# Patient Record
Sex: Female | Born: 1940 | Race: Black or African American | Hispanic: No | Marital: Single | Smoking: Never smoker
Health system: Southern US, Community
[De-identification: ages and names within clinical notes are randomized; demographics above are authoritative.]

## PROBLEM LIST (undated history)

## (undated) DIAGNOSIS — I6203 Nontraumatic chronic subdural hemorrhage: Secondary | ICD-10-CM

## (undated) DIAGNOSIS — I10 Essential (primary) hypertension: Secondary | ICD-10-CM

## (undated) DIAGNOSIS — N183 Chronic kidney disease, stage 3 (moderate): Secondary | ICD-10-CM

## (undated) DIAGNOSIS — I6200 Nontraumatic subdural hemorrhage, unspecified: Secondary | ICD-10-CM

## (undated) DIAGNOSIS — D509 Iron deficiency anemia, unspecified: Secondary | ICD-10-CM

## (undated) DIAGNOSIS — J449 Chronic obstructive pulmonary disease, unspecified: Secondary | ICD-10-CM

## (undated) DIAGNOSIS — I5033 Acute on chronic diastolic (congestive) heart failure: Secondary | ICD-10-CM

## (undated) DIAGNOSIS — K219 Gastro-esophageal reflux disease without esophagitis: Secondary | ICD-10-CM

## (undated) DIAGNOSIS — F1120 Opioid dependence, uncomplicated: Secondary | ICD-10-CM

## (undated) DIAGNOSIS — I5030 Unspecified diastolic (congestive) heart failure: Secondary | ICD-10-CM

## (undated) HISTORY — DX: Iron deficiency anemia, unspecified: D50.9

## (undated) HISTORY — DX: Gastro-esophageal reflux disease without esophagitis: K21.9

## (undated) HISTORY — DX: Chronic kidney disease, stage 3 (moderate): N18.3

## (undated) HISTORY — PX: EYE SURGERY: SHX253

## (undated) HISTORY — DX: Nontraumatic subdural hemorrhage, unspecified: I62.00

## (undated) HISTORY — DX: Acute on chronic diastolic (congestive) heart failure: I50.33

## (undated) HISTORY — DX: Unspecified diastolic (congestive) heart failure: I50.30

## (undated) HISTORY — PX: BURR HOLE FOR SUBDURAL HEMATOMA: SHX1275

## (undated) HISTORY — PX: TUBAL LIGATION: SHX77

## (undated) HISTORY — DX: Opioid dependence, uncomplicated: F11.20

## (undated) HISTORY — DX: Chronic obstructive pulmonary disease, unspecified: J44.9

## (undated) HISTORY — DX: Nontraumatic chronic subdural hemorrhage: I62.03

## (undated) HISTORY — PX: ABDOMINAL HYSTERECTOMY: SHX81

## (undated) HISTORY — DX: Essential (primary) hypertension: I10

---

## 2016-01-05 DIAGNOSIS — D509 Iron deficiency anemia, unspecified: Secondary | ICD-10-CM

## 2016-01-05 DIAGNOSIS — I480 Paroxysmal atrial fibrillation: Secondary | ICD-10-CM

## 2016-01-05 DIAGNOSIS — I10 Essential (primary) hypertension: Secondary | ICD-10-CM

## 2016-01-05 DIAGNOSIS — J449 Chronic obstructive pulmonary disease, unspecified: Secondary | ICD-10-CM

## 2016-01-05 DIAGNOSIS — K219 Gastro-esophageal reflux disease without esophagitis: Secondary | ICD-10-CM | POA: Insufficient documentation

## 2016-01-05 HISTORY — DX: Paroxysmal atrial fibrillation: I48.0

## 2016-01-05 HISTORY — DX: Iron deficiency anemia, unspecified: D50.9

## 2016-01-05 HISTORY — DX: Essential (primary) hypertension: I10

## 2016-01-05 HISTORY — DX: Gastro-esophageal reflux disease without esophagitis: K21.9

## 2016-01-05 HISTORY — DX: Chronic obstructive pulmonary disease, unspecified: J44.9

## 2016-05-07 DIAGNOSIS — M545 Low back pain, unspecified: Secondary | ICD-10-CM

## 2016-05-07 DIAGNOSIS — N183 Chronic kidney disease, stage 3 unspecified: Secondary | ICD-10-CM

## 2016-05-07 DIAGNOSIS — G8929 Other chronic pain: Secondary | ICD-10-CM | POA: Insufficient documentation

## 2016-05-07 DIAGNOSIS — M8589 Other specified disorders of bone density and structure, multiple sites: Secondary | ICD-10-CM | POA: Insufficient documentation

## 2016-05-07 HISTORY — DX: Other chronic pain: G89.29

## 2016-05-07 HISTORY — DX: Chronic kidney disease, stage 3 unspecified: N18.30

## 2016-05-07 HISTORY — DX: Low back pain, unspecified: M54.50

## 2016-05-07 HISTORY — DX: Other specified disorders of bone density and structure, multiple sites: M85.89

## 2016-07-24 DIAGNOSIS — F1120 Opioid dependence, uncomplicated: Secondary | ICD-10-CM | POA: Insufficient documentation

## 2016-07-24 HISTORY — DX: Opioid dependence, uncomplicated: F11.20

## 2016-10-31 DIAGNOSIS — R0602 Shortness of breath: Secondary | ICD-10-CM

## 2016-10-31 HISTORY — DX: Shortness of breath: R06.02

## 2016-12-25 DIAGNOSIS — I6200 Nontraumatic subdural hemorrhage, unspecified: Secondary | ICD-10-CM

## 2016-12-25 DIAGNOSIS — Z8679 Personal history of other diseases of the circulatory system: Secondary | ICD-10-CM | POA: Insufficient documentation

## 2016-12-25 HISTORY — DX: Personal history of other diseases of the circulatory system: Z86.79

## 2016-12-25 HISTORY — DX: Nontraumatic subdural hemorrhage, unspecified: I62.00

## 2016-12-26 DIAGNOSIS — I6203 Nontraumatic chronic subdural hemorrhage: Secondary | ICD-10-CM

## 2016-12-26 HISTORY — DX: Nontraumatic chronic subdural hemorrhage: I62.03

## 2016-12-27 DIAGNOSIS — I5033 Acute on chronic diastolic (congestive) heart failure: Secondary | ICD-10-CM

## 2016-12-27 DIAGNOSIS — I5030 Unspecified diastolic (congestive) heart failure: Secondary | ICD-10-CM

## 2016-12-27 HISTORY — DX: Unspecified diastolic (congestive) heart failure: I50.30

## 2016-12-27 HISTORY — DX: Acute on chronic diastolic (congestive) heart failure: I50.33

## 2017-02-04 DIAGNOSIS — Z9889 Other specified postprocedural states: Secondary | ICD-10-CM | POA: Insufficient documentation

## 2017-02-04 HISTORY — DX: Other specified postprocedural states: Z98.890

## 2017-05-17 DIAGNOSIS — Z9889 Other specified postprocedural states: Secondary | ICD-10-CM

## 2017-05-17 DIAGNOSIS — Z8679 Personal history of other diseases of the circulatory system: Secondary | ICD-10-CM | POA: Insufficient documentation

## 2017-08-14 DIAGNOSIS — Z6838 Body mass index (BMI) 38.0-38.9, adult: Secondary | ICD-10-CM

## 2017-08-14 DIAGNOSIS — K089 Disorder of teeth and supporting structures, unspecified: Secondary | ICD-10-CM | POA: Insufficient documentation

## 2017-08-14 DIAGNOSIS — Z6841 Body Mass Index (BMI) 40.0 and over, adult: Secondary | ICD-10-CM

## 2017-08-14 DIAGNOSIS — E6609 Other obesity due to excess calories: Secondary | ICD-10-CM | POA: Insufficient documentation

## 2017-08-14 HISTORY — DX: Morbid (severe) obesity due to excess calories: E66.01

## 2017-08-14 HISTORY — DX: Body Mass Index (BMI) 40.0 and over, adult: Z684

## 2017-09-17 ENCOUNTER — Encounter: Payer: Self-pay | Admitting: Cardiology

## 2017-09-17 ENCOUNTER — Encounter: Payer: Self-pay | Admitting: *Deleted

## 2017-09-17 ENCOUNTER — Ambulatory Visit (INDEPENDENT_AMBULATORY_CARE_PROVIDER_SITE_OTHER): Payer: Medicare Other | Admitting: Cardiology

## 2017-09-17 VITALS — BP 116/68 | HR 76 | Wt 230.0 lb

## 2017-09-17 DIAGNOSIS — I10 Essential (primary) hypertension: Secondary | ICD-10-CM | POA: Diagnosis not present

## 2017-09-17 DIAGNOSIS — I2510 Atherosclerotic heart disease of native coronary artery without angina pectoris: Secondary | ICD-10-CM | POA: Insufficient documentation

## 2017-09-17 DIAGNOSIS — Z8679 Personal history of other diseases of the circulatory system: Secondary | ICD-10-CM

## 2017-09-17 DIAGNOSIS — R0789 Other chest pain: Secondary | ICD-10-CM | POA: Diagnosis not present

## 2017-09-17 DIAGNOSIS — Z9889 Other specified postprocedural states: Secondary | ICD-10-CM

## 2017-09-17 DIAGNOSIS — I6200 Nontraumatic subdural hemorrhage, unspecified: Secondary | ICD-10-CM | POA: Diagnosis not present

## 2017-09-17 DIAGNOSIS — I480 Paroxysmal atrial fibrillation: Secondary | ICD-10-CM | POA: Diagnosis not present

## 2017-09-17 HISTORY — DX: Atherosclerotic heart disease of native coronary artery without angina pectoris: I25.10

## 2017-09-17 NOTE — Progress Notes (Signed)
Cardiology Consultation:    Date:  09/17/2017   ID:  Kristina Mcguire, DOB 24-Dec-1940, MRN [id number]  PCP:  Francesca Oman, DO  Cardiologist:  Jenne Campus, MD   Referring MD: Francesca Oman, DO   Chief Complaint  Patient presents with  . Atrial Fibrillation  . Congestive Heart Failure  I needs dental surgery  History of Present Illness:    Kristina Mcguire is a 77 y.o. female who is being seen today for the evaluation of multiple cardiac issues before oral surgery at the request of Francesca Oman, DO.  Apparently in 70s she had myocardial infarction.  She told me that some doctor look at her EKG and told her she had a heart attack however there was no damage to her heart.  Since that time she had multiple stress test last 22,016 all were normal.  There is also some limitation in the chart that she did have a cardiac catheterization years ago which apparently was normal.  She also history of paroxysmal atrial fibrillation.  Less than a year ago she ended up having spontaneous subdural hematoma she developed slurred speech and then evaluation was done she was find to have subdural hematoma she required surgical intervention anticoagulation has been discontinued at that time.  She has been off anticoagulation since then.  Described to have rare episode of palpitations those would last for a few minutes happening may be once a month or so.  Denies having dizziness or passing out.  No TIA/CVA symptoms.  Described to have chest pain that pain happens in different situation but sometimes when she eats sometimes drinking a glass of water will help with this sensation she takes Prilosec on as-needed basis that seems to be helping.  Her ability to exercise is very limited because of chronic back problem.  Past Medical History:  Diagnosis Date  . Acute on chronic diastolic congestive heart failure ([place]) 12/27/2016  . Benign essential hypertension 01/05/2016   Last Assessment & Plan:  At goal  controlled continue same medications DASH diet reinforced  . Chronic intracranial subdural hematoma ([place]) 12/26/2016   Last Assessment & Plan:  Patient for TCM. Admission note, discharge summary and instructions have been reviewed.  Patient admitted for headache, and found to have a left subacute subdural hematoma. Patient status post  Surgical intervention, prior to reversal of pradaxa. Patient doing very good, no complications from procedure. Patient will have follow up appointment with neurosurgery on 8/14 for   . Chronic kidney disease, stage III (moderate) (HCC) 05/07/2016   Last Assessment & Plan:  Stable,  monitoring,  . COPD (chronic obstructive pulmonary disease) ([place]) 01/05/2016   Last Assessment & Plan:  Never smoked in the past nor present, but she has been diagnosed with COPD. Restarted home dose PRN Albuterol. Last Assessment & Plan:  Never smoked in the past nor present, but she has been diagnosed with COPD. Restarted home dose PRN Albuterol.  [name] GERD (gastroesophageal reflux disease) 01/05/2016   Last Assessment & Plan:  Restarted on home dose of PPI Last Assessment & Plan:  Restarted on home dose of PPI  . Heart failure with preserved ejection fraction ([place]) 12/27/2016  . Microcytic anemia 01/05/2016  . Subdural hemorrhage ([place]) 12/25/2016   Last Assessment & Plan:  Patient has been doing well since her discharge from the hospital and since her recent surgery on 12/27/2016.  She denies all neurologic complaints.  She states that the right-sided weakness that she was  experiencing prior to surgery has completely resolved.  She denies any headaches at this time.  She denies any issues with loss of consciousness or seizure-like activity.   . Uncomplicated opioid dependence ([place]) 07/24/2016   Last Assessment & Plan:  Referring to [facility] program On hydrocodone daily PRN, Last Prescription filled 07/24/16. No drug seeking behavior, sign of abuse or misuse      Current Medications: Current Meds    Medication Sig  . albuterol (PROVENTIL HFA;VENTOLIN HFA) 108 (90 Base) MCG/ACT inhaler Inhale 2 puffs into the lungs.  [name] atorvastatin (LIPITOR) 20 MG tablet Take 1 tablet by mouth daily.  . carvedilol (COREG) 12.5 MG tablet Take 1 tablet by mouth 2 (two) times daily.  . cetirizine (ZYRTEC) 10 MG tablet Take 1 tablet by mouth daily.  . Cholecalciferol (VITAMIN D-1000 MAX ST) 1000 units tablet Take 2,000 Units by mouth 2 (two) times daily.  . digoxin (LANOXIN) 0.125 MG tablet Take 1 tablet by mouth daily.  [name] docusate sodium (COLACE) 100 MG capsule Take 1 capsule by mouth as needed.  . ferrous sulfate 325 (65 FE) MG tablet Take 1 tablet by mouth daily.  . fluticasone (FLONASE) 50 MCG/ACT nasal spray Place 1 spray into the nose daily.  . furosemide (LASIX) 20 MG tablet Take 1 tablet by mouth 2 (two) times daily.  [name] HYDROcodone-acetaminophen (NORCO/VICODIN) 5-325 MG tablet Take 1 tablet by mouth every 8 (eight) hours as needed.  [name] lisinopril (PRINIVIL,ZESTRIL) 20 MG tablet Take 1 tablet by mouth daily.  . Multiple Vitamins-Minerals (MULTIVITAMIN ADULT PO) Take 1 tablet by mouth daily.  . pantoprazole (PROTONIX) 20 MG tablet Take 1 tablet by mouth daily.  . polyvinyl alcohol (LIQUIFILM TEARS) 1.4 % ophthalmic solution 2 drops as needed.  . potassium chloride (K-DUR) 10 MEQ tablet Take 1 tablet by mouth daily.     Allergies:   Penicillins and Pollen extract   Social History   Socioeconomic History  . Marital status: Single    Spouse name: Not on file  . Number of children: Not on file  . Years of education: Not on file  . Highest education level: Not on file  Occupational History  . Not on file  Social Needs  . Financial resource strain: Not on file  . Food insecurity:    Worry: Not on file    Inability: Not on file  . Transportation needs:    Medical: Not on file    Non-medical: Not on file  Tobacco Use  . Smoking status: Never Smoker  . Smokeless tobacco: Never Used  Substance  and Sexual Activity  . Alcohol use: Never    Frequency: Never  . Drug use: Never  . Sexual activity: Not on file  Lifestyle  . Physical activity:    Days per week: Not on file    Minutes per session: Not on file  . Stress: Not on file  Relationships  . Social connections:    Talks on phone: Not on file    Gets together: Not on file    Attends religious service: Not on file    Active member of club or organization: Not on file    Attends meetings of clubs or organizations: Not on file    Relationship status: Not on file  Other Topics Concern  . Not on file  Social History Narrative  . Not on file     Family History: The patient's family history includes Heart disease in her father, mother, and sister; Hypertension  in her mother and sister; Stroke in her mother. ROS:   Please see the history of present illness.    All 14 point review of systems negative except as described per history of present illness.  EKGs/Labs/Other Studies Reviewed:    The following studies were reviewed today:   EKG:  EKG is  ordered today.  The ekg ordered today demonstrates normal sinus rhythm normal P interval RSR prime pattern in V1.  Nonspecific ST segment changes  Recent Labs: No results found for requested labs within last 8760 hours.  Recent Lipid Panel No results found for: CHOL, TRIG, HDL, CHOLHDL, VLDL, LDLCALC, LDLDIRECT  Physical Exam:    VS:  BP 116/68   Pulse 76   Wt 230 lb (104.3 kg)   SpO2 97%     Wt Readings from Last 3 Encounters:  09/17/17 230 lb (104.3 kg)     GEN:  Well nourished, well developed in no acute distress HEENT: Normal NECK: No JVD; No carotid bruits LYMPHATICS: No lymphadenopathy CARDIAC: RRR, no murmurs, no rubs, no gallops RESPIRATORY:  Clear to auscultation without rales, wheezing or rhonchi  ABDOMEN: Soft, non-tender, non-distended MUSCULOSKELETAL:  No edema; No deformity  SKIN: Warm and dry NEUROLOGIC:  Alert and oriented x 3 PSYCHIATRIC:   Normal affect   ASSESSMENT:    1. Paroxysmal atrial fibrillation (HCC)   2. Benign essential hypertension   3. Subdural hemorrhage ([place])   4. S/P evacuation of subdural hematoma   5. Coronary artery disease involving native coronary artery of native heart without angina pectoris    PLAN:    In order of problems listed above:  1. Paroxysmal atrial fibrillation.  Her chads 2 Vascor equals 5 ideally she needs to be anticoagulated obviously with history of spontaneous subdural hematoma that is out of the question however I think she would be a perfect candidate for watchman device.  I initiated conversation with her as well with her daughter regarding that issue I gave him a brochure about watchman device.  We will continue this discussion when I see her next time. 2. Atypical chest pain.  She will be scheduled to have Lexiscan to make sure that she has no inducible ischemia. 3. Coronary artery disease with supposedly past medical history of myocardial infarction.  Echocardiogram will be done to assess left ventricular ejection fraction and look for segmental wall motion abnormalities 4. Preop evaluation for dental surgery.  Overall dental surgery is considered low risk surgery it is anesthesia that will be used in the process is local there from more from my point of view that should be no problem proceed with surgery.  If we talking about potentially doing a watchman device from her her oral surgery should be done before.   Medication Adjustments/Labs and Tests Ordered: Current medicines are reviewed at length with the patient today.  Concerns regarding medicines are outlined above.  No orders of the defined types were placed in this encounter.  No orders of the defined types were placed in this encounter.   Signed, Park Liter, MD, [facility]. 09/17/2017 4:24 PM    [facility]

## 2017-09-17 NOTE — Patient Instructions (Signed)
Medication Instructions:  Your physician recommends that you continue on your current medications as directed. Please refer to the Current Medication list given to you today.   Labwork: None  Testing/Procedures: You had an EKG today.   Your physician has requested that you have an echocardiogram. Echocardiography is a painless test that uses sound waves to create images of your heart. It provides your doctor with information about the size and shape of your heart and how well your heart's chambers and valves are working. This procedure takes approximately one hour. There are no restrictions for this procedure.  Your physician has requested that you have a lexiscan myoview. For further information please visit [email or web address]. Please follow instruction sheet, as given.   Follow-Up: Your physician recommends that you schedule a follow-up appointment in: 1 month.  Any Other Special Instructions Will Be Listed Below (If Applicable).     If you need a refill on your cardiac medications before your next appointment, please call your pharmacy.

## 2017-09-22 ENCOUNTER — Telehealth (HOSPITAL_COMMUNITY): Payer: Self-pay | Admitting: *Deleted

## 2017-09-22 NOTE — Telephone Encounter (Signed)
Patient given detailed instructions per Myocardial Perfusion Study Information Sheet for the test on 09/25/17. Patient notified to arrive 15 minutes early and that it is imperative to arrive on time for appointment to keep from having the test rescheduled.  If you need to cancel or reschedule your appointment, please call the office within 24 hours of your appointment. . Patient verbalized understanding. Kirstie Peri

## 2017-09-25 ENCOUNTER — Other Ambulatory Visit: Payer: Self-pay

## 2017-09-25 ENCOUNTER — Ambulatory Visit (HOSPITAL_BASED_OUTPATIENT_CLINIC_OR_DEPARTMENT_OTHER): Payer: Medicare Other

## 2017-09-25 ENCOUNTER — Ambulatory Visit (HOSPITAL_COMMUNITY): Payer: Medicare Other | Attending: Cardiology

## 2017-09-25 VITALS — Ht 65.5 in | Wt 230.0 lb

## 2017-09-25 DIAGNOSIS — J449 Chronic obstructive pulmonary disease, unspecified: Secondary | ICD-10-CM | POA: Insufficient documentation

## 2017-09-25 DIAGNOSIS — R0789 Other chest pain: Secondary | ICD-10-CM | POA: Diagnosis not present

## 2017-09-25 DIAGNOSIS — I071 Rheumatic tricuspid insufficiency: Secondary | ICD-10-CM | POA: Insufficient documentation

## 2017-09-25 DIAGNOSIS — I130 Hypertensive heart and chronic kidney disease with heart failure and stage 1 through stage 4 chronic kidney disease, or unspecified chronic kidney disease: Secondary | ICD-10-CM | POA: Insufficient documentation

## 2017-09-25 DIAGNOSIS — I4891 Unspecified atrial fibrillation: Secondary | ICD-10-CM | POA: Insufficient documentation

## 2017-09-25 DIAGNOSIS — I2510 Atherosclerotic heart disease of native coronary artery without angina pectoris: Secondary | ICD-10-CM

## 2017-09-25 DIAGNOSIS — I253 Aneurysm of heart: Secondary | ICD-10-CM | POA: Diagnosis not present

## 2017-09-25 DIAGNOSIS — I272 Pulmonary hypertension, unspecified: Secondary | ICD-10-CM | POA: Diagnosis not present

## 2017-09-25 DIAGNOSIS — E669 Obesity, unspecified: Secondary | ICD-10-CM | POA: Diagnosis not present

## 2017-09-25 DIAGNOSIS — N183 Chronic kidney disease, stage 3 (moderate): Secondary | ICD-10-CM | POA: Diagnosis not present

## 2017-09-25 DIAGNOSIS — Z6837 Body mass index (BMI) 37.0-37.9, adult: Secondary | ICD-10-CM | POA: Insufficient documentation

## 2017-09-25 DIAGNOSIS — I509 Heart failure, unspecified: Secondary | ICD-10-CM | POA: Insufficient documentation

## 2017-09-25 DIAGNOSIS — R0602 Shortness of breath: Secondary | ICD-10-CM

## 2017-09-25 LAB — MYOCARDIAL PERFUSION IMAGING
CHL CUP NUCLEAR SRS: 3
CHL CUP RESTING HR STRESS: 63 {beats}/min
CSEPPHR: 81 {beats}/min
LV dias vol: 96 mL (ref 46–106)
LV sys vol: 42 mL
RATE: 0.32
SDS: 2
SSS: 5
TID: 0.89

## 2017-09-25 MED ORDER — TECHNETIUM TC 99M PYROPHOSPHATE
10.4000 | Freq: Once | INTRAVENOUS | Status: AC
  Administered 2017-09-25: 10.4 via INTRAVENOUS

## 2017-09-25 MED ORDER — TECHNETIUM TC 99M TETROFOSMIN IV KIT
32.8000 | PACK | Freq: Once | INTRAVENOUS | Status: AC | PRN
  Administered 2017-09-25: 32.8 via INTRAVENOUS
  Filled 2017-09-25: qty 33

## 2017-09-25 MED ORDER — REGADENOSON 0.4 MG/5ML IV SOLN
0.4000 mg | Freq: Once | INTRAVENOUS | Status: AC
  Administered 2017-09-25: 0.4 mg via INTRAVENOUS

## 2017-10-21 ENCOUNTER — Ambulatory Visit (INDEPENDENT_AMBULATORY_CARE_PROVIDER_SITE_OTHER): Payer: Medicare Other | Admitting: Cardiology

## 2017-10-21 ENCOUNTER — Encounter: Payer: Self-pay | Admitting: Cardiology

## 2017-10-21 VITALS — BP 120/64 | HR 64 | Ht 65.5 in | Wt 229.0 lb

## 2017-10-21 DIAGNOSIS — I480 Paroxysmal atrial fibrillation: Secondary | ICD-10-CM

## 2017-10-21 DIAGNOSIS — I5032 Chronic diastolic (congestive) heart failure: Secondary | ICD-10-CM

## 2017-10-21 DIAGNOSIS — Z9889 Other specified postprocedural states: Secondary | ICD-10-CM

## 2017-10-21 DIAGNOSIS — I10 Essential (primary) hypertension: Secondary | ICD-10-CM | POA: Diagnosis not present

## 2017-10-21 DIAGNOSIS — Z8679 Personal history of other diseases of the circulatory system: Secondary | ICD-10-CM

## 2017-10-21 NOTE — Progress Notes (Signed)
Cardiology Office Note:    Date:  10/21/2017   ID:  Kristina Mcguire, DOB 1940-09-19, MRN [id number]  PCP:  Francesca Oman, DO  Cardiologist:  Jenne Campus, MD    Referring MD: Francesca Oman, DO   Chief Complaint  Patient presents with  . 1 month follow up  Well  History of Present Illness:    Kristina Mcguire is a 77 y.o. female with prior history of small myocardial infarction, was sent to [place] to be evaluated before dental surgery.  She seems to be doing well denies have any chest pain tightness squeezing pressure burning chest.  Fatigue and exertional shortness of breath is there.  Past Medical History:  Diagnosis Date  . Acute on chronic diastolic congestive heart failure ([place]) 12/27/2016  . Benign essential hypertension 01/05/2016   Last Assessment & Plan:  At goal controlled continue same medications DASH diet reinforced  . Chronic intracranial subdural hematoma ([place]) 12/26/2016   Last Assessment & Plan:  Patient for TCM. Admission note, discharge summary and instructions have been reviewed.  Patient admitted for headache, and found to have a left subacute subdural hematoma. Patient status post  Surgical intervention, prior to reversal of pradaxa. Patient doing very good, no complications from procedure. Patient will have follow up appointment with neurosurgery on 8/14 for   . Chronic kidney disease, stage III (moderate) (HCC) 05/07/2016   Last Assessment & Plan:  Stable,  monitoring,  . COPD (chronic obstructive pulmonary disease) ([place]) 01/05/2016   Last Assessment & Plan:  Never smoked in the past nor present, but she has been diagnosed with COPD. Restarted home dose PRN Albuterol. Last Assessment & Plan:  Never smoked in the past nor present, but she has been diagnosed with COPD. Restarted home dose PRN Albuterol.  [name] GERD (gastroesophageal reflux disease) 01/05/2016   Last Assessment & Plan:  Restarted on home dose of PPI Last Assessment & Plan:  Restarted on home dose of PPI  .  Heart failure with preserved ejection fraction ([place]) 12/27/2016  . Microcytic anemia 01/05/2016  . Subdural hemorrhage ([place]) 12/25/2016   Last Assessment & Plan:  Patient has been doing well since her discharge from the hospital and since her recent surgery on 12/27/2016.  She denies all neurologic complaints.  She states that the right-sided weakness that she was experiencing prior to surgery has completely resolved.  She denies any headaches at this time.  She denies any issues with loss of consciousness or seizure-like activity.   . Uncomplicated opioid dependence ([place]) 07/24/2016   Last Assessment & Plan:  Referring to [facility] program On hydrocodone daily PRN, Last Prescription filled 07/24/16. No drug seeking behavior, sign of abuse or misuse      Current Medications: Current Meds  Medication Sig  . albuterol (PROVENTIL HFA;VENTOLIN HFA) 108 (90 Base) MCG/ACT inhaler Inhale 2 puffs into the lungs.  [name] atorvastatin (LIPITOR) 20 MG tablet Take 1 tablet by mouth daily.  . carvedilol (COREG) 12.5 MG tablet Take 1 tablet by mouth 2 (two) times daily.  . cetirizine (ZYRTEC) 10 MG tablet Take 1 tablet by mouth daily.  . Cholecalciferol (VITAMIN D-1000 MAX ST) 1000 units tablet Take 2,000 Units by mouth 2 (two) times daily.  . digoxin (LANOXIN) 0.125 MG tablet Take 1 tablet by mouth daily.  [name] docusate sodium (COLACE) 100 MG capsule Take 1 capsule by mouth as needed.  . ferrous sulfate 325 (65 FE) MG tablet Take 1 tablet by mouth daily.  [name]  fluticasone (FLONASE) 50 MCG/ACT nasal spray Place 1 spray into the nose daily.  . furosemide (LASIX) 20 MG tablet Take 1 tablet by mouth 2 (two) times daily.  [name] HYDROcodone-acetaminophen (NORCO/VICODIN) 5-325 MG tablet Take 1 tablet by mouth every 8 (eight) hours as needed.  [name] lisinopril (PRINIVIL,ZESTRIL) 20 MG tablet Take 1 tablet by mouth daily.  . Multiple Vitamins-Minerals (MULTIVITAMIN ADULT PO) Take 1 tablet by mouth daily.  . pantoprazole (PROTONIX) 20 MG  tablet Take 1 tablet by mouth daily.  . polyvinyl alcohol (LIQUIFILM TEARS) 1.4 % ophthalmic solution 2 drops as needed.  . potassium chloride (K-DUR) 10 MEQ tablet Take 1 tablet by mouth daily.     Allergies:   Penicillins and Pollen extract   Social History   Socioeconomic History  . Marital status: Single    Spouse name: Not on file  . Number of children: Not on file  . Years of education: Not on file  . Highest education level: Not on file  Occupational History  . Not on file  Social Needs  . Financial resource strain: Not on file  . Food insecurity:    Worry: Not on file    Inability: Not on file  . Transportation needs:    Medical: Not on file    Non-medical: Not on file  Tobacco Use  . Smoking status: Never Smoker  . Smokeless tobacco: Never Used  Substance and Sexual Activity  . Alcohol use: Never    Frequency: Never  . Drug use: Never  . Sexual activity: Not on file  Lifestyle  . Physical activity:    Days per week: Not on file    Minutes per session: Not on file  . Stress: Not on file  Relationships  . Social connections:    Talks on phone: Not on file    Gets together: Not on file    Attends religious service: Not on file    Active member of club or organization: Not on file    Attends meetings of clubs or organizations: Not on file    Relationship status: Not on file  Other Topics Concern  . Not on file  Social History Narrative  . Not on file     Family History: The patient's family history includes Heart disease in her father, mother, and sister; Hypertension in her mother and sister; Stroke in her mother. ROS:   Please see the history of present illness.    All 14 point review of systems negative except as described per history of present illness  EKGs/Labs/Other Studies Reviewed:      Recent Labs: No results found for requested labs within last 8760 hours.  Recent Lipid Panel No results found for: CHOL, TRIG, HDL, CHOLHDL, VLDL,  LDLCALC, LDLDIRECT  Physical Exam:    VS:  BP 120/64   Pulse 64   Ht 5' 5.5" (1.664 m)   Wt 229 lb (103.9 kg)   SpO2 98%   BMI 37.53 kg/m     Wt Readings from Last 3 Encounters:  10/21/17 229 lb (103.9 kg)  09/25/17 230 lb (104.3 kg)  09/17/17 230 lb (104.3 kg)     GEN:  Well nourished, well developed in no acute distress HEENT: Normal NECK: No JVD; No carotid bruits LYMPHATICS: No lymphadenopathy CARDIAC: RRR, no murmurs, no rubs, no gallops RESPIRATORY:  Clear to auscultation without rales, wheezing or rhonchi  ABDOMEN: Soft, non-tender, non-distended MUSCULOSKELETAL:  No edema; No deformity  SKIN: Warm and dry LOWER EXTREMITIES:  no swelling NEUROLOGIC:  Alert and oriented x 3 PSYCHIATRIC:  Normal affect   ASSESSMENT:    1. Paroxysmal atrial fibrillation (HCC)   2. S/P evacuation of subdural hematoma   3. Chronic heart failure with preserved ejection fraction ([place])   4. Benign essential hypertension    PLAN:    In order of problems listed above:  1. Paroxysmal atrial fibrillation with chads 2 Vascor equals 5.  Ideally she need to be anticoagulated but she is not because of history of spontaneous subdural hematoma.  Again we continued discussion about watchman device she said she needs a little more time to think about it. 2. Need for oral surgery: From my point of view that should be no problem.  She did have a stress test done which showed no evidence of ischemia echocardiogram showed preserved left ventricular ejection fraction. 3. Chronic diastolic heart failure.  Compensated on appropriate medications which I will continue. 4. Essential hypertension blood pressure well controlled we will continue present management.  I see her back in my office in about 2 months.  She is planning to have dental surgery done within a month.  I asked her to think about watchman device if she decided she want to proceed she need to let me know if not we can continue this discussion  in 2 months.   Medication Adjustments/Labs and Tests Ordered: Current medicines are reviewed at length with the patient today.  Concerns regarding medicines are outlined above.  No orders of the defined types were placed in this encounter.  Medication changes: No orders of the defined types were placed in this encounter.   Signed, Park Liter, MD, [facility] 10/21/2017 10:44 AM    [place]

## 2017-10-21 NOTE — Patient Instructions (Signed)
Medication Instructions:  Your physician recommends that you continue on your current medications as directed. Please refer to the Current Medication list given to you today.   Labwork: None  Testing/Procedures: None  Follow-Up: Your physician recommends that you schedule a follow-up appointment in: 2 months.  If you need a refill on your cardiac medications before your next appointment, please call your pharmacy.   Thank you for choosing [facility]! Mysha Peeler, RN [phone]    

## 2017-12-17 DIAGNOSIS — R519 Headache, unspecified: Secondary | ICD-10-CM | POA: Insufficient documentation

## 2017-12-23 ENCOUNTER — Encounter: Payer: Self-pay | Admitting: *Deleted

## 2017-12-23 ENCOUNTER — Encounter: Payer: Self-pay | Admitting: Cardiology

## 2017-12-23 ENCOUNTER — Ambulatory Visit (INDEPENDENT_AMBULATORY_CARE_PROVIDER_SITE_OTHER): Payer: Medicare Other | Admitting: Cardiology

## 2017-12-23 VITALS — BP 100/64 | HR 74 | Ht 65.5 in | Wt 228.8 lb

## 2017-12-23 DIAGNOSIS — Z9889 Other specified postprocedural states: Secondary | ICD-10-CM | POA: Diagnosis not present

## 2017-12-23 DIAGNOSIS — I2510 Atherosclerotic heart disease of native coronary artery without angina pectoris: Secondary | ICD-10-CM

## 2017-12-23 DIAGNOSIS — I480 Paroxysmal atrial fibrillation: Secondary | ICD-10-CM

## 2017-12-23 DIAGNOSIS — I10 Essential (primary) hypertension: Secondary | ICD-10-CM | POA: Diagnosis not present

## 2017-12-23 DIAGNOSIS — Z8679 Personal history of other diseases of the circulatory system: Secondary | ICD-10-CM

## 2017-12-23 NOTE — Progress Notes (Signed)
Cardiology Office Note:    Date:  12/23/2017   ID:  Kristina Mcguire, DOB [birth year], MRN [id number]  PCP:  Francesca Oman, DO  Cardiologist:  Jenne Campus, MD    Referring MD: Francesca Oman, DO   Chief Complaint  Patient presents with  . 2 month follow up  Doing well  History of Present Illness:    Kristina Mcguire is a 77 y.o. female with paroxysmal atrial fibrillation.  Her chads 2 Vascor equals 5 however she is not anticoagulated because of history of subdural hematoma.  She also does have COPD diastolic congestive heart failure essential hypertension I initiated conversation with her regarding watchman device she gets some thoughts to it and she would like to explore it.  Therefore, I will refer her to [facility] for evaluation for potential watchman procedure.  Overall she is doing well denies have any chest pain tightness squeezing pressure burning chest fatigue is there denies having any palpitations or dizziness  Past Medical History:  Diagnosis Date  . Acute on chronic diastolic congestive heart failure ([place]) 12/27/2016  . Benign essential hypertension 01/05/2016   Last Assessment & Plan:  At goal controlled continue same medications DASH diet reinforced  . 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 Admission note, discharge summary and instructions have been reviewed.  Patient admitted for headache, and found to have a left subacute subdural hematoma. Patient status post  Surgical intervention, prior to reversal of pradaxa. Patient doing very good, no complications from procedure. Patient will have follow up appointment with neurosurgery on 8/14 for   . Chronic kidney disease, stage III (moderate) (HCC) 05/07/2016   Last Assessment & Plan:  Stable,  monitoring,  . COPD (chronic obstructive pulmonary disease) ([place]) 01/05/2016   Last Assessment & Plan:  Never smoked in the past nor present, but she has been diagnosed with  COPD. Restarted home dose PRN Albuterol. Last Assessment & Plan:  Never smoked in the past nor present, but she has been diagnosed with COPD. Restarted home dose PRN Albuterol.  [name] GERD (gastroesophageal reflux disease) 01/05/2016   Last Assessment & Plan:  Restarted on home dose of PPI Last Assessment & Plan:  Restarted on home dose of PPI  . Heart failure with preserved ejection fraction ([place]) 12/27/2016  . Microcytic anemia 01/05/2016  . Subdural hemorrhage ([place]) 12/25/2016   Last Assessment & Plan:  Patient has been doing well since her discharge from the hospital and since her recent surgery on 12/27/2016.  She denies all neurologic complaints.  She states that the right-sided weakness that she was experiencing prior to surgery has completely resolved.  She denies any headaches at this time.  She denies any issues with loss of consciousness or seizure-like activity.   . Uncomplicated opioid dependence ([place]) 07/24/2016   Last Assessment & Plan:  Referring to [facility] program On hydrocodone daily PRN, Last Prescription filled 07/24/16. No drug seeking behavior, sign of abuse or misuse      Current Medications: Current Meds  Medication Sig  . albuterol (PROVENTIL HFA;VENTOLIN HFA) 108 (90 Base) MCG/ACT inhaler Inhale 2 puffs into the lungs.  [name] atorvastatin (LIPITOR) 20 MG tablet Take 1 tablet by mouth daily.  . carvedilol (COREG) 12.5 MG tablet Take 1 tablet by mouth 2 (two) times daily.  . cetirizine (ZYRTEC) 10 MG tablet Take 1 tablet by mouth daily.  . Cholecalciferol (VITAMIN D-1000 MAX ST) 1000 units tablet Take  2,000 Units by mouth 2 (two) times daily.  . digoxin (LANOXIN) 0.125 MG tablet Take 1 tablet by mouth daily.  [name] docusate sodium (COLACE) 100 MG capsule Take 1 capsule by mouth as needed.  . ferrous sulfate 325 (65 FE) MG tablet Take 1 tablet by mouth daily.  . fluticasone (FLONASE) 50 MCG/ACT nasal spray Place 1 spray into the nose daily.  . furosemide (LASIX) 20 MG tablet Take 1  tablet by mouth 2 (two) times daily.  [name] lisinopril (PRINIVIL,ZESTRIL) 20 MG tablet Take 1 tablet by mouth daily.  . Multiple Vitamins-Minerals (MULTIVITAMIN ADULT PO) Take 1 tablet by mouth daily.  . pantoprazole (PROTONIX) 20 MG tablet Take 1 tablet by mouth daily.  . polyvinyl alcohol (LIQUIFILM TEARS) 1.4 % ophthalmic solution 2 drops as needed.  . potassium chloride (K-DUR) 10 MEQ tablet Take 1 tablet by mouth daily.     Allergies:   Penicillins and Pollen extract   Social History   Socioeconomic History  . Marital status: Single    Spouse name: Not on file  . Number of children: Not on file  . Years of education: Not on file  . Highest education level: Not on file  Occupational History  . Not on file  Social Needs  . Financial resource strain: Not on file  . Food insecurity:    Worry: Not on file    Inability: Not on file  . Transportation needs:    Medical: Not on file    Non-medical: Not on file  Tobacco Use  . Smoking status: Never Smoker  . Smokeless tobacco: Never Used  Substance and Sexual Activity  . Alcohol use: Never    Frequency: Never  . Drug use: Never  . Sexual activity: Not on file  Lifestyle  . Physical activity:    Days per week: Not on file    Minutes per session: Not on file  . Stress: Not on file  Relationships  . Social connections:    Talks on phone: Not on file    Gets together: Not on file    Attends religious service: Not on file    Active member of club or organization: Not on file    Attends meetings of clubs or organizations: Not on file    Relationship status: Not on file  Other Topics Concern  . Not on file  Social History Narrative  . Not on file     Family History: The patient's family history includes Heart disease in her father, mother, and sister; Hypertension in her mother and sister; Stroke in her mother. ROS:   Please see the history of present illness.    All 14 point review of systems negative except as described  per history of present illness  EKGs/Labs/Other Studies Reviewed:      Recent Labs: No results found for requested labs within last 8760 hours.  Recent Lipid Panel No results found for: CHOL, TRIG, HDL, CHOLHDL, VLDL, LDLCALC, LDLDIRECT  Physical Exam:    VS:  BP 100/64   Pulse 74   Ht 5' 5.5" (1.664 m)   Wt 228 lb 12.8 oz (103.8 kg)   SpO2 96%   BMI 37.50 kg/m     Wt Readings from Last 3 Encounters:  12/23/17 228 lb 12.8 oz (103.8 kg)  10/21/17 229 lb (103.9 kg)  09/25/17 230 lb (104.3 kg)     GEN:  Well nourished, well developed in no acute distress HEENT: Normal NECK: No JVD; No carotid bruits  LYMPHATICS: No lymphadenopathy CARDIAC: RRR, no murmurs, no rubs, no gallops RESPIRATORY:  Clear to auscultation without rales, wheezing or rhonchi  ABDOMEN: Soft, non-tender, non-distended MUSCULOSKELETAL:  No edema; No deformity  SKIN: Warm and dry LOWER EXTREMITIES: no swelling NEUROLOGIC:  Alert and oriented x 3 PSYCHIATRIC:  Normal affect   ASSESSMENT:    1. Paroxysmal atrial fibrillation (HCC)   2. Benign essential hypertension   3. Coronary artery disease involving native coronary artery of native heart without angina pectoris   4. S/P evacuation of subdural hematoma    PLAN:    In order of problems listed above:  1. Paroxysmal atrial fibrillation not anticoagulated because of history of subdural hematoma.  She was referred to [facility] for [place] device. 2. Benign essential hypertension.  Blood pressure appears to be well controlled today we will continue present management. 3. Coronary artery disease stable 4. Status post subdural hematoma recently she still having some neurological issues she saw her neurosurgeon CT of her head was done no new findings.   Medication Adjustments/Labs and Tests Ordered: Current medicines are reviewed at length with the patient today.  Concerns regarding medicines are outlined above.  No orders of the defined types were  placed in this encounter.  Medication changes: No orders of the defined types were placed in this encounter.   Signed, Park Liter, MD, [facility] 12/23/2017 11:02 AM    [place]

## 2017-12-23 NOTE — Patient Instructions (Signed)
Medication Instructions:  Your physician recommends that you continue on your current medications as directed. Please refer to the Current Medication list given to you today.   Labwork: None.  Testing/Procedures: None.  Follow-Up: Your physician wants you to follow-up in: 3 months. You will receive a reminder letter in the mail two months in advance. If you don't receive a letter, please call our office to schedule the follow-up appointment.   Any Other Special Instructions Will Be Listed Below (If Applicable).  You have been referred to [facility] to discuss the placement of a watchman device. You will be contacted to schedule this appointment.    Left Atrial Appendage Closure Device Implantation Left atrial appendage (LAA) closure device implantation is a procedure that is done to place a small device in the LAA of the heart. The left atrium is one of the heart's two upper chambers, and the LAA is a small sac in the wall of the left atrium. The device closes the LAA. This procedure can help prevent a stroke caused by atrial fibrillation. Atrial fibrillation is a type of irregular or rapid heartbeat (arrhythmia). When the heart does not beat normally and the heartbeat is irregular, there is an increased risk of blood clots and stroke. A blood clot can form in the LAA. Tell a health care provider about:  Any allergies you have.  All medicines you are taking, including vitamins, herbs, eye drops, creams, and over-the-counter medicines.  Any problems you or family members have had with anesthetic medicines.  Any blood disorders you have.  Any surgeries you have had.  Any medical conditions you have.  Whether you are pregnant or may be pregnant. What are the risks? Generally, this is a safe procedure. However, problems may occur, including:  Infection.  Bleeding.  Allergic reactions to medicines or dyes.  Damage to nearby structures or organs.  Heart  attack.  Stroke.  Blood clots.  Changes in heart rhythm.  Device failure.  What happens before the procedure? Medicines  Ask your health care provider about: ? Changing or stopping your regular medicines. This is especially important if you are taking diabetes medicines or blood thinners. ? Taking medicines such as aspirin and ibuprofen. These medicines can thin your blood. Do not take these medicines unless your health care provider tells you to take them. ? Taking over-the-counter medicines, vitamins, herbs, and supplements.  You may be given antibiotic medicine to help prevent an infection. Staying hydrated Follow instructions from your health care provider about hydration, which may include:  Up to 2 hours before the procedure - you may continue to drink clear liquids, such as water, clear fruit juice, black coffee, and plain tea.  Eating and drinking restrictions Follow instructions from your health care provider about eating and drinking, which may include:  8 hours before the procedure - stop eating heavy meals or foods such as meat, fried foods, or fatty foods.  6 hours before the procedure - stop eating light meals or foods, such as toast or cereal.  6 hours before the procedure - stop drinking milk or drinks that contain milk.  2 hours before the procedure - stop drinking clear liquids.  General instructions  Do not use any products that contain nicotine or tobacco, such as cigarettes and e-cigarettes. If you need help quitting, ask your health care provider.  You will have blood tests and a physical exam.  You will have a test called an electrocardiogram (ECG) to check your heart's electrical  patterns and rhythms.  You may be asked to shower with a germ-killing soap.  Plan to have someone take you home from the hospital or clinic.  Plan to have a responsible adult care for you for at least 24 hours after you leave the hospital or clinic. This is  important. What happens during the procedure?  To lower your risk of infection: ? Your health care team will wash or sanitize their hands. ? Hair may be removed from the surgical area. ? Your skin will be washed with soap.  An IV will be inserted into one of your veins.  You will be given one or more of the following: ? A medicine to help you relax (sedative). ? A medicine to make you fall asleep (general anesthetic).  A small incision will be made in your groin area.  A small wire will be put through the incision and into a blood vessel.  X-ray dye may be injected so X-rays can be used to guide the wire through the blood vessel.  A long, thin tube (catheter) will be put over the small wire and moved up through the blood vessel to reach your heart.  The closure device will be moved through the catheter until it reaches your heart.  A small hole will be made in the septum (transseptal puncture). The septum is a thin tissue that separates the upper two chambers of the heart.  The device will be placed so that it closes the LAA. X-rays will be done to make sure the device is in the right place.  The catheter and wire will be removed. The closure device will remain in your heart.  A bandage (dressing) will be placed over the site where the catheter was inserted. Pressure will be applied to prevent any bleeding. The procedure may vary among health care providers and hospitals. What happens after the procedure?  Your blood pressure, heart rate, breathing rate, and blood oxygen level will be monitored until the medicines you were given have worn off.  You may have to wear compression stockings. These stockings help to prevent blood clots and reduce swelling in your legs.  Do not drive for 24 hours if you were given a sedative. Ask your health care provider when it is safe for you to drive.  You may be given pain medicine.  You may need to drink more fluids to wash (flush) the X-ray  dye out of your body.  Take over-the-counter and prescription medicines only as told by your health care provider. This is especially important if you were given blood thinners. Summary  Left atrial appendage (LAA) closure device implantation is a surgery that is done to put a small device in one of the heart's upper chambers (left atrium). The device is placed in a small sac in the wall of the left atrium (left atrial appendage).  The device closes the LAA to prevent strokes and other problems.  Follow instructions from your health care provider before and after the procedure. This information is not intended to replace advice given to you by your health care provider. Make sure you discuss any questions you have with your health care provider. Document Released: 10/01/2016 Document Revised: 10/01/2016 Document Reviewed: 10/01/2016 Elsevier Interactive Patient Education  [employer].      If you need a refill on your cardiac medications before your next appointment, please call your pharmacy.

## 2018-03-05 DIAGNOSIS — R768 Other specified abnormal immunological findings in serum: Secondary | ICD-10-CM

## 2018-03-05 DIAGNOSIS — R7689 Other specified abnormal immunological findings in serum: Secondary | ICD-10-CM | POA: Insufficient documentation

## 2018-03-05 HISTORY — DX: Other specified abnormal immunological findings in serum: R76.8

## 2018-03-17 DIAGNOSIS — Z95818 Presence of other cardiac implants and grafts: Secondary | ICD-10-CM

## 2018-03-17 HISTORY — DX: Presence of other cardiac implants and grafts: Z95.818

## 2018-07-23 ENCOUNTER — Encounter: Payer: Self-pay | Admitting: Cardiology

## 2018-07-23 ENCOUNTER — Ambulatory Visit (INDEPENDENT_AMBULATORY_CARE_PROVIDER_SITE_OTHER): Payer: Medicare Other | Admitting: Cardiology

## 2018-07-23 VITALS — BP 110/68 | HR 72 | Ht 65.0 in | Wt 237.1 lb

## 2018-07-23 DIAGNOSIS — Z95818 Presence of other cardiac implants and grafts: Secondary | ICD-10-CM | POA: Insufficient documentation

## 2018-07-23 DIAGNOSIS — I2510 Atherosclerotic heart disease of native coronary artery without angina pectoris: Secondary | ICD-10-CM

## 2018-07-23 DIAGNOSIS — I5032 Chronic diastolic (congestive) heart failure: Secondary | ICD-10-CM

## 2018-07-23 DIAGNOSIS — I10 Essential (primary) hypertension: Secondary | ICD-10-CM | POA: Diagnosis not present

## 2018-07-23 DIAGNOSIS — I480 Paroxysmal atrial fibrillation: Secondary | ICD-10-CM | POA: Diagnosis not present

## 2018-07-23 NOTE — Patient Instructions (Signed)
Medication Instructions:  Your physician recommends that you continue on your current medications as directed. Please refer to the Current Medication list given to you today.  If you need a refill on your cardiac medications before your next appointment, please call your pharmacy.   Lab work: Your physician recommends that you return for lab work today: cbc with diff, and digoxin level   If you have labs (blood work) drawn today and your tests are completely normal, you will receive your results only by: [name] MyChart Message (if you have MyChart) OR . A paper copy in the mail If you have any lab test that is abnormal or we need to change your treatment, we will call you to review the results.  Testing/Procedures: None.   Follow-Up: At [facility], you and your health needs are our priority.  As part of our continuing mission to provide you with exceptional heart care, we have created designated Provider Care Teams.  These Care Teams include your primary Cardiologist (physician) and Advanced Practice Providers (APPs -  Physician Assistants and Nurse Practitioners) who all work together to provide you with the care you need, when you need it. You will need a follow up appointment in 5 months.  Please call our office 2 months in advance to schedule this appointment.  You may see No primary care provider on file. or another member of our [employer] Provider Team in [place]: Shirlee More, MD . Jyl Heinz, MD  Any Other Special Instructions Will Be Listed Below (If Applicable).

## 2018-07-23 NOTE — Progress Notes (Signed)
Cardiology Office Note:    Date:  07/23/2018   ID:  Kristina Mcguire, DOB 1940-11-23, MRN [id number]  PCP:  Francesca Oman, DO  Cardiologist:  Jenne Campus, MD    Referring MD: Francesca Oman, DO   Chief Complaint  Patient presents with  . Follow-up  I am nauseated  History of Present Illness:    Kristina Mcguire is a 78 y.o. female with paroxysmal atrial fibrillation, diastolic congestive heart failure, coronary artery disease comes today to my office for follow-up overall she seems to be doing well but complaining of feeling feverish this morning she said she thinks she is gotten cold she does have some dry cough.  No shortness of breath no chest pain tightness squeezing pressure burning chest.  Past Medical History:  Diagnosis Date  . Acute on chronic diastolic congestive heart failure ([place]) 12/27/2016  . Benign essential hypertension 01/05/2016   Last Assessment & Plan:  At goal controlled continue same medications DASH diet reinforced  . Chronic intracranial subdural hematoma ([name]) 12/26/2016   Last Assessment & Plan:  Patient for TCM. Admission note, discharge summary and instructions have been reviewed.  Patient admitted for headache, and found to have a left subacute subdural hematoma. Patient status post  Surgical intervention, prior to reversal of pradaxa. Patient doing very good, no complications from procedure. Patient will have follow up appointment with neurosurgery on 8/14 for   . Chronic kidney disease, stage III (moderate) (HCC) 05/07/2016   Last Assessment & Plan:  Stable,  monitoring,  . COPD (chronic obstructive pulmonary disease) ([place]) 01/05/2016   Last Assessment & Plan:  Never smoked in the past nor present, but she has been diagnosed with COPD. Restarted home dose PRN Albuterol. Last Assessment & Plan:  Never smoked in the past nor present, but she has been diagnosed with COPD. Restarted home dose PRN Albuterol.  [name] GERD (gastroesophageal reflux disease) 01/05/2016   Last Assessment & Plan:  Restarted on home dose of PPI Last Assessment & Plan:  Restarted on home dose of PPI  . Heart failure with preserved ejection fraction ([name]) 12/27/2016  . Microcytic anemia 01/05/2016  . Subdural hemorrhage ([name]) 12/25/2016   Last Assessment & Plan:  Patient has been doing well since her discharge from the hospital and since her recent surgery on 12/27/2016.  She denies all neurologic complaints.  She states that the right-sided weakness that she was experiencing prior to surgery has completely resolved.  She denies any headaches at this time.  She denies any issues with loss of consciousness or seizure-like activity.   . Uncomplicated opioid dependence ([name]) 07/24/2016   Last Assessment & Plan:  Referring to [facility] program On hydrocodone daily PRN, Last Prescription filled 07/24/16. No drug seeking behavior, sign of abuse or misuse      Current Medications: Current Meds  Medication Sig  . albuterol (PROVENTIL HFA;VENTOLIN HFA) 108 (90 Base) MCG/ACT inhaler Inhale 2 puffs into the lungs.  [name] atorvastatin (LIPITOR) 20 MG tablet Take 1 tablet by mouth daily.  . carvedilol (COREG) 12.5 MG tablet Take 1 tablet by mouth 2 (two) times daily.  . cetirizine (ZYRTEC) 10 MG tablet Take 1 tablet by mouth daily.  . Cholecalciferol (VITAMIN D-1000 MAX ST) 1000 units tablet Take 2,000 Units by mouth 2 (two) times daily.  . clopidogrel (PLAVIX) 75 MG tablet Take 1 tablet by mouth daily.  . digoxin (LANOXIN) 0.125 MG tablet Take 1 tablet by mouth daily.  [name] docusate sodium (COLACE)  100 MG capsule Take 1 capsule by mouth as needed.  . ferrous sulfate 325 (65 FE) MG tablet Take 1 tablet by mouth daily.  . fluticasone (FLONASE) 50 MCG/ACT nasal spray Place 1 spray into the nose daily.  . furosemide (LASIX) 20 MG tablet Take 1 tablet by mouth 2 (two) times daily.  [name] lisinopril (PRINIVIL,ZESTRIL) 20 MG tablet Take 1 tablet by mouth daily.  . Multiple Vitamins-Minerals (MULTIVITAMIN ADULT PO)  Take 1 tablet by mouth daily.  . pantoprazole (PROTONIX) 20 MG tablet Take 1 tablet by mouth daily.  . polyvinyl alcohol (LIQUIFILM TEARS) 1.4 % ophthalmic solution 2 drops as needed.  . potassium chloride (K-DUR) 10 MEQ tablet Take 1 tablet by mouth daily.     Allergies:   Penicillins and Pollen extract   Social History   Socioeconomic History  . Marital status: Single    Spouse name: Not on file  . Number of children: Not on file  . Years of education: Not on file  . Highest education level: Not on file  Occupational History  . Not on file  Social Needs  . Financial resource strain: Not on file  . Food insecurity:    Worry: Not on file    Inability: Not on file  . Transportation needs:    Medical: Not on file    Non-medical: Not on file  Tobacco Use  . Smoking status: Never Smoker  . Smokeless tobacco: Never Used  Substance and Sexual Activity  . Alcohol use: Never    Frequency: Never  . Drug use: Never  . Sexual activity: Not on file  Lifestyle  . Physical activity:    Days per week: Not on file    Minutes per session: Not on file  . Stress: Not on file  Relationships  . Social connections:    Talks on phone: Not on file    Gets together: Not on file    Attends religious service: Not on file    Active member of club or organization: Not on file    Attends meetings of clubs or organizations: Not on file    Relationship status: Not on file  Other Topics Concern  . Not on file  Social History Narrative  . Not on file     Family History: The patient's family history includes Heart disease in her father, mother, and sister; Hypertension in her mother and sister; Stroke in her mother. ROS:   Please see the history of present illness.    All 14 point review of systems negative except as described per history of present illness  EKGs/Labs/Other Studies Reviewed:      Recent Labs: No results found for requested labs within last 8760 hours.  Recent Lipid  Panel No results found for: CHOL, TRIG, HDL, CHOLHDL, VLDL, LDLCALC, LDLDIRECT  Physical Exam:    VS:  BP 110/68   Pulse 72   Ht 5\' 5"  (1.651 m)   Wt 237 lb 1.9 oz (107.6 kg)   SpO2 98%   BMI 39.46 kg/m     Wt Readings from Last 3 Encounters:  07/23/18 237 lb 1.9 oz (107.6 kg)  12/23/17 228 lb 12.8 oz (103.8 kg)  10/21/17 229 lb (103.9 kg)     GEN:  Well nourished, well developed in no acute distress HEENT: Normal NECK: No JVD; No carotid bruits LYMPHATICS: No lymphadenopathy CARDIAC: RRR, no murmurs, no rubs, no gallops RESPIRATORY:  Clear to auscultation without rales, wheezing or rhonchi  ABDOMEN: Soft,  non-tender, non-distended MUSCULOSKELETAL:  No edema; No deformity  SKIN: Warm and dry LOWER EXTREMITIES: no swelling NEUROLOGIC:  Alert and oriented x 3 PSYCHIATRIC:  Normal affect   ASSESSMENT:    1. Paroxysmal atrial fibrillation (HCC)   2. Chronic heart failure with preserved ejection fraction ([place])   3. Coronary artery disease involving native coronary artery of native heart without angina pectoris   4. Benign essential hypertension    PLAN:    In order of problems listed above:  1. Paroxysmal atrial fibrillation.  She is not anticoagulated because of history of subdural hematoma.  She does have a watchman device will continue present management. 2. Coronary disease denies have any chest pain tightness squeezing pressure burning chest.  She still takes care of activities of daily living with no difficulties. 3. Nausea I suspect may be digoxin toxicity asked her to stop digoxin will check digoxin level. 4. She still feels sick today I will check her CBC plus differential.  Will be scheduled to have echocardiogram to check left ventricular ejection fraction I see her back in my office in about 5 months   Medication Adjustments/Labs and Tests Ordered: Current medicines are reviewed at length with the patient today.  Concerns regarding medicines are outlined  above.  No orders of the defined types were placed in this encounter.  Medication changes: No orders of the defined types were placed in this encounter.   Signed, Park Liter, MD, [facility] 07/23/2018 11:46 AM    [place]

## 2018-07-24 ENCOUNTER — Telehealth: Payer: Self-pay | Admitting: Emergency Medicine

## 2018-07-24 LAB — CBC WITH DIFFERENTIAL
BASOS: 0 %
Basophils Absolute: 0 10*3/uL (ref 0.0–0.2)
EOS (ABSOLUTE): 0.3 10*3/uL (ref 0.0–0.4)
EOS: 3 %
HEMOGLOBIN: 13.4 g/dL (ref 11.1–15.9)
Hematocrit: 42 % (ref 34.0–46.6)
IMMATURE GRANS (ABS): 0.1 10*3/uL (ref 0.0–0.1)
Immature Granulocytes: 1 %
LYMPHS: 13 %
Lymphocytes Absolute: 1.4 10*3/uL (ref 0.7–3.1)
MCH: 26 pg — AB (ref 26.6–33.0)
MCHC: 31.9 g/dL (ref 31.5–35.7)
MCV: 82 fL (ref 79–97)
MONOCYTES: 8 %
Monocytes Absolute: 0.9 10*3/uL (ref 0.1–0.9)
NEUTROS ABS: 8.2 10*3/uL — AB (ref 1.4–7.0)
Neutrophils: 75 %
RBC: 5.15 x10E6/uL (ref 3.77–5.28)
RDW: 14.9 % (ref 11.7–15.4)
WBC: 10.9 10*3/uL — AB (ref 3.4–10.8)

## 2018-07-24 LAB — DIGOXIN LEVEL: Digoxin, Serum: 0.7 ng/mL (ref 0.5–0.9)

## 2018-07-24 MED ORDER — DIGOXIN 125 MCG PO TABS: 125.0000 ug | ORAL_TABLET | ORAL | 1 refills | Status: AC

## 2018-07-24 NOTE — Telephone Encounter (Signed)
Patient informed of results and advised to decrease digoxin to only every other day. Patient verbally understands.

## 2018-09-10 ENCOUNTER — Telehealth: Payer: Self-pay | Admitting: Cardiology

## 2018-09-10 NOTE — Telephone Encounter (Signed)
After lab results from 07/23/2018 were reviewed, patient was instructed to reduce digoxin 0.125 mg from daily to every other day. Please advise if any further changes need to be made to digoxin or if patient should continue taking this medication as she has been. Thanks!

## 2018-09-10 NOTE — Telephone Encounter (Signed)
Keep the same dose of digoxin. The reason for lowering the dose is the fact that she was nauseated. Is she better?

## 2018-09-10 NOTE — Telephone Encounter (Signed)
Patient has been taking digoxin every other day, should she go back to every day. Please advise.

## 2018-09-10 NOTE — Telephone Encounter (Signed)
Patient informed to continue current dose of digoxin at every other day. Patient states that her nausea has resolved. No further questions.

## 2018-11-11 ENCOUNTER — Telehealth: Payer: Self-pay | Admitting: Cardiology

## 2018-11-11 NOTE — Telephone Encounter (Signed)
Called Gail back regarding patient. She was calling to check and see if patient was still on plavix. I informed her yes that she was. She ask me to inform Dr. Agustin Cree that per Dr. Denman George who implanted watchmen device she was to stop Plavix in April 2020 and resume Aspirin, she asks he review this. Will consult with Dr. Agustin Cree.

## 2018-11-11 NOTE — Telephone Encounter (Signed)
Baker Janus from [facility] wants a call back regarding this patient. Her number is [phone].

## 2018-11-13 MED ORDER — ASPIRIN EC 81 MG PO TBEC: 81.0000 mg | DELAYED_RELEASE_TABLET | Freq: Every day | ORAL | 1 refills | Status: AC

## 2018-11-13 NOTE — Addendum Note (Signed)
Addended by: Ashok Norris on: 11/13/2018 02:11 PM   Modules accepted: Orders

## 2018-11-13 NOTE — Telephone Encounter (Signed)
Patient informed to stop plavix and start aspirin 81 mg daily. No further questions.

## 2018-11-13 NOTE — Telephone Encounter (Signed)
It should b eok to stop plavix, continue asa 81 g po qd

## 2018-12-08 ENCOUNTER — Encounter: Payer: Self-pay | Admitting: Cardiology

## 2018-12-08 ENCOUNTER — Ambulatory Visit (INDEPENDENT_AMBULATORY_CARE_PROVIDER_SITE_OTHER): Payer: Medicare Other | Admitting: Cardiology

## 2018-12-08 ENCOUNTER — Other Ambulatory Visit: Payer: Self-pay

## 2018-12-08 VITALS — BP 118/64 | HR 65 | Wt 242.4 lb

## 2018-12-08 DIAGNOSIS — I10 Essential (primary) hypertension: Secondary | ICD-10-CM

## 2018-12-08 DIAGNOSIS — I5032 Chronic diastolic (congestive) heart failure: Secondary | ICD-10-CM | POA: Diagnosis not present

## 2018-12-08 DIAGNOSIS — I480 Paroxysmal atrial fibrillation: Secondary | ICD-10-CM

## 2018-12-08 DIAGNOSIS — I2510 Atherosclerotic heart disease of native coronary artery without angina pectoris: Secondary | ICD-10-CM | POA: Diagnosis not present

## 2018-12-08 DIAGNOSIS — E782 Mixed hyperlipidemia: Secondary | ICD-10-CM

## 2018-12-08 HISTORY — DX: Mixed hyperlipidemia: E78.2

## 2018-12-08 NOTE — Progress Notes (Signed)
Cardiology Office Note:    Date:  12/08/2018   ID:  Kristina Mcguire, DOB July 30, 1940, MRN [id number]  PCP:  Francesca Oman, DO  Cardiologist:  Jenne Campus, MD  Electrophysiologist:  None   Referring MD: Francesca Oman, DO   Chief Complaint: 78 yo female presents for 6 month follow up of cardiac conditions including PAF, diastolic HF, CAD.   History of Present Illness:    Kristina Mcguire is a 78 y.o. female with a hx of PAF s/p Watchman device, diastolic congestive heart failure, CAD last seen by me 07/23/18. Additional PMH COPD, CKD III, subdural hematoma 12/2016.  Since her last office visit her digoxin dose has been reduced due to every other day due to nausea.  Lexiscan 09/2017 low risk study.  Echo 09/2017 with EF 50 to 53%, grade 1 diastolic dysfunction, trivial AR, atrial septal aneurysm. Overall doing well.  Today is the first day since January that she left her home because of coronavirus.  She is happy denies have any cardiac complaints no palpitations no swelling of lower extremities.  Past Medical History:  Diagnosis Date  . Acute on chronic diastolic congestive heart failure ([place]) 12/27/2016  . Benign essential hypertension 01/05/2016   Last Assessment & Plan:  At goal controlled continue same medications DASH diet reinforced  . Chronic intracranial subdural hematoma ([place]) 12/26/2016   Last Assessment & Plan:  Patient for TCM. Admission note, discharge summary and instructions have been reviewed.  Patient admitted for headache, and found to have a left subacute subdural hematoma. Patient status post  Surgical intervention, prior to reversal of pradaxa. Patient doing very good, no complications from procedure. Patient will have follow up appointment with neurosurgery on 8/14 for   . Chronic kidney disease, stage III (moderate) (HCC) 05/07/2016   Last Assessment & Plan:  Stable,  monitoring,  . COPD (chronic obstructive pulmonary disease) ([place]) 01/05/2016   Last Assessment & Plan:   Never smoked in the past nor present, but she has been diagnosed with COPD. Restarted home dose PRN Albuterol. Last Assessment & Plan:  Never smoked in the past nor present, but she has been diagnosed with COPD. Restarted home dose PRN Albuterol.  [name] GERD (gastroesophageal reflux disease) 01/05/2016   Last Assessment & Plan:  Restarted on home dose of PPI Last Assessment & Plan:  Restarted on home dose of PPI  . Heart failure with preserved ejection fraction ([place]) 12/27/2016  . Microcytic anemia 01/05/2016  . Subdural hemorrhage ([place]) 12/25/2016   Last Assessment & Plan:  Patient has been doing well since her discharge from the hospital and since her recent surgery on 12/27/2016.  She denies all neurologic complaints.  She states that the right-sided weakness that she was experiencing prior to surgery has completely resolved.  She denies any headaches at this time.  She denies any issues with loss of consciousness or seizure-like activity.   . Uncomplicated opioid dependence ([place]) 07/24/2016   Last Assessment & Plan:  Referring to [facility] program On hydrocodone daily PRN, Last Prescription filled 07/24/16. No drug seeking behavior, sign of abuse or misuse      Current Medications: Current Meds  Medication Sig  . albuterol (PROVENTIL HFA;VENTOLIN HFA) 108 (90 Base) MCG/ACT inhaler Inhale 2 puffs into the lungs.  [name] aspirin EC 81 MG tablet Take 1 tablet (81 mg total) by mouth daily.  [name] atorvastatin (LIPITOR) 20 MG tablet Take 1 tablet by mouth daily.  . carvedilol (COREG) 12.5 MG tablet  Take 1 tablet by mouth 2 (two) times daily.  . cetirizine (ZYRTEC) 10 MG tablet Take 1 tablet by mouth daily.  . Cholecalciferol (VITAMIN D-1000 MAX ST) 1000 units tablet Take 2,000 Units by mouth 2 (two) times daily.  . digoxin (LANOXIN) 0.125 MG tablet Take 1 tablet (125 mcg total) by mouth every other day.  . docusate sodium (COLACE) 100 MG capsule Take 1 capsule by mouth as needed.  . ferrous sulfate 325 (65 FE) MG  tablet Take 1 tablet by mouth daily.  . fluticasone (FLONASE) 50 MCG/ACT nasal spray Place 1 spray into the nose daily.  . furosemide (LASIX) 20 MG tablet Take 1 tablet by mouth 2 (two) times daily.  [name] lisinopril (PRINIVIL,ZESTRIL) 20 MG tablet Take 1 tablet by mouth daily.  . Multiple Vitamins-Minerals (MULTIVITAMIN ADULT PO) Take 1 tablet by mouth daily.  . pantoprazole (PROTONIX) 20 MG tablet Take 1 tablet by mouth daily.  . polyvinyl alcohol (LIQUIFILM TEARS) 1.4 % ophthalmic solution 2 drops as needed.  . potassium chloride (K-DUR) 10 MEQ tablet Take 1 tablet by mouth daily.     Allergies:   Penicillins and Pollen extract   Social History   Socioeconomic History  . Marital status: Single    Spouse name: Not on file  . Number of children: Not on file  . Years of education: Not on file  . Highest education level: Not on file  Occupational History  . Not on file  Social Needs  . Financial resource strain: Not on file  . Food insecurity    Worry: Not on file    Inability: Not on file  . Transportation needs    Medical: Not on file    Non-medical: Not on file  Tobacco Use  . Smoking status: Never Smoker  . Smokeless tobacco: Never Used  Substance and Sexual Activity  . Alcohol use: Never    Frequency: Never  . Drug use: Never  . Sexual activity: Not on file  Lifestyle  . Physical activity    Days per week: Not on file    Minutes per session: Not on file  . Stress: Not on file  Relationships  . Social Herbalist on phone: Not on file    Gets together: Not on file    Attends religious service: Not on file    Active member of club or organization: Not on file    Attends meetings of clubs or organizations: Not on file    Relationship status: Not on file  Other Topics Concern  . Not on file  Social History Narrative  . Not on file     Family History: The patient's family history includes Heart disease in her father, mother, and sister; Hypertension in  her mother and sister; Stroke in her mother.  ROS:   Please see the history of present illness.    ROS All other systems reviewed and are negative.  EKGs/Labs/Other Studies Reviewed:    The following studies were reviewed today: Echo 09/2017 Left ventricle: The cavity size was normal. Wall thickness was   normal. Systolic function was normal. The estimated ejection   fraction was in the range of 50% to 55%. Wall motion was normal;   there were no regional wall motion abnormalities. Doppler   parameters are consistent with abnormal left ventricular   relaxation (grade 1 diastolic dysfunction). - Aortic valve: There was trivial regurgitation. - Atrial septum: There was an atrial septal aneurysm. - Pulmonary arteries:  Systolic pressure was mildly increased. PA   peak pressure: 40 mm Hg (S).  Lexiscan 09/2017 Low risk study  EKG:  No EKG today.   Recent Labs: 07/23/2018: Hemoglobin 13.4   Recent Lipid Panel No results found for: CHOL, TRIG, HDL, CHOLHDL, VLDL, LDLCALC, LDLDIRECT  Physical Exam:    VS:  BP 118/64   Pulse 65   Wt 242 lb 6.4 oz (110 kg)   SpO2 98%   BMI 40.34 kg/m     Wt Readings from Last 3 Encounters:  12/08/18 242 lb 6.4 oz (110 kg)  07/23/18 237 lb 1.9 oz (107.6 kg)  12/23/17 228 lb 12.8 oz (103.8 kg)     GEN:  Well nourished, well developed in no acute distress HEENT: Normal NECK: No JVD; No carotid bruits LYMPHATICS: No lymphadenopathy CARDIAC: RRR, no murmurs, rubs, gallops RESPIRATORY:   Clear to auscultation without rales, wheezing or rhonchi  ABDOMEN: Soft, non-tender, non-distended MUSCULOSKELETAL:  No edema; No deformity  SKIN: Warm and dry NEUROLOGIC:  Alert and oriented x 3 PSYCHIATRIC:  Normal affect   ASSESSMENT:    1. Paroxysmal atrial fibrillation (HCC)   2. Chronic heart failure with preserved ejection fraction ([place])   3. Coronary artery disease involving native coronary artery of native heart without angina pectoris   4.  Benign essential hypertension   5. Mixed hyperlipidemia    PLAN:    In order of problems listed above:  1. PAF - s/p Watchman device as she cannot be on anticoagulation due to history of subdural hematoma.  Doing well.  Denies having any palpitations 2. Chronic diastolic heart failure -stable and compensated continue present management 3. CAD -no chest pain doing well 4. HTN -well controlled 5. HLD - Followed by PCP. Continue Atorvastatin 20mg  daily.    Medication Adjustments/Labs and Tests Ordered: Current medicines are reviewed at length with the patient today.  Concerns regarding medicines are outlined above.  No orders of the defined types were placed in this encounter.  No orders of the defined types were placed in this encounter.   There are no Patient Instructions on file for this visit.   Signed, Loel Dubonnet, NP  12/08/2018 3:26 PM    [place]  Patient seen and examined by me.  Agree with above note.  Plan was created by me. Park Liter, MD

## 2018-12-08 NOTE — Patient Instructions (Signed)
Medication Instructions:  Your physician recommends that you continue on your current medications as directed. Please refer to the Current Medication list given to you today.  If you need a refill on your cardiac medications before your next appointment, please call your pharmacy.   Lab work: None If you have labs (blood work) drawn today and your tests are completely normal, you will receive your results only by: [name] MyChart Message (if you have MyChart) OR . A paper copy in the mail If you have any lab test that is abnormal or we need to change your treatment, we will call you to review the results.  Testing/Procedures: NOne  Follow-Up: At [facility], you and your health needs are our priority.  As part of our continuing mission to provide you with exceptional heart care, we have created designated Provider Care Teams.  These Care Teams include your primary Cardiologist (physician) and Advanced Practice Providers (APPs -  Physician Assistants and Nurse Practitioners) who all work together to provide you with the care you need, when you need it. You will need a follow up appointment in 6 months.   Any Other Special Instructions Will Be Listed Below (If Applicable).

## 2019-05-31 ENCOUNTER — Ambulatory Visit: Payer: Medicare Other | Admitting: Cardiology

## 2019-06-02 ENCOUNTER — Ambulatory Visit: Payer: Medicare Other | Admitting: Cardiology

## 2019-07-22 ENCOUNTER — Encounter: Payer: Self-pay | Admitting: Cardiology

## 2019-07-22 ENCOUNTER — Telehealth: Payer: Self-pay | Admitting: *Deleted

## 2019-07-22 ENCOUNTER — Other Ambulatory Visit: Payer: Self-pay

## 2019-07-22 ENCOUNTER — Telehealth: Payer: Self-pay | Admitting: Emergency Medicine

## 2019-07-22 ENCOUNTER — Telehealth (INDEPENDENT_AMBULATORY_CARE_PROVIDER_SITE_OTHER): Payer: Medicare Other | Admitting: Cardiology

## 2019-07-22 VITALS — BP 117/71 | HR 71 | Ht 65.0 in | Wt 253.0 lb

## 2019-07-22 DIAGNOSIS — I509 Heart failure, unspecified: Secondary | ICD-10-CM

## 2019-07-22 DIAGNOSIS — I5032 Chronic diastolic (congestive) heart failure: Secondary | ICD-10-CM

## 2019-07-22 DIAGNOSIS — E782 Mixed hyperlipidemia: Secondary | ICD-10-CM | POA: Diagnosis not present

## 2019-07-22 DIAGNOSIS — I110 Hypertensive heart disease with heart failure: Secondary | ICD-10-CM

## 2019-07-22 DIAGNOSIS — I6200 Nontraumatic subdural hemorrhage, unspecified: Secondary | ICD-10-CM

## 2019-07-22 DIAGNOSIS — I2510 Atherosclerotic heart disease of native coronary artery without angina pectoris: Secondary | ICD-10-CM | POA: Diagnosis not present

## 2019-07-22 DIAGNOSIS — I10 Essential (primary) hypertension: Secondary | ICD-10-CM

## 2019-07-22 DIAGNOSIS — Z87820 Personal history of traumatic brain injury: Secondary | ICD-10-CM

## 2019-07-22 NOTE — Telephone Encounter (Signed)

## 2019-07-22 NOTE — Progress Notes (Signed)
Virtual Visit via Telephone Note   This visit type was conducted due to national recommendations for restrictions regarding the COVID-19 Pandemic (e.g. social distancing) in an effort to limit this patient's exposure and mitigate transmission in our community.  Due to her co-morbid illnesses, this patient is at least at moderate risk for complications without adequate follow up.  This format is felt to be most appropriate for this patient at this time.  The patient did not have access to video technology/had technical difficulties with video requiring transitioning to audio format only (telephone).  All issues noted in this document were discussed and addressed.  No physical exam could be performed with this format.  Please refer to the patient's chart for her  consent to telehealth for [facility].  Evaluation Performed:  Follow-up visit  This visit type was conducted due to national recommendations for restrictions regarding the COVID-19 Pandemic (e.g. social distancing).  This format is felt to be most appropriate for this patient at this time.  All issues noted in this document were discussed and addressed.  No physical exam was performed (except for noted visual exam findings with Video Visits).  Please refer to the patient's chart (MyChart message for video visits and phone note for telephone visits) for the patient's consent to telehealth for [facility]  Date:  07/22/2019  ID: Kristina Mcguire, DOB 07/04/1940, MRN [id number]   Patient Location: [address] [place] [address]   Provider location:   [place] Office  PCP:  Francesca Oman, DO  Cardiologist:  Jenne Campus, MD     Chief Complaint: I am doing well  History of Present Illness:    Kristina Mcguire is a 79 y.o. female  who presents via audio/video conferencing for a telehealth visit today.  With past medical history significant for paroxysmal atrial fibrillation, status post  watchman device secondary to falls and subdural hematoma that required surgical evacuation, coronary artery disease, COPD and chronic kidney failure.  She does have virtual visit with me today.  Overall she is doing well.  Denies having any symptoms.  No tach palpitations no shortness of breath no tightness squeezing pressure burning chest.  She is trying to be active but because of poor weather she is majority of time staying home.  She already got first dose of coronavirus vaccine and second dose will be within next 10 days.  She is very happy about that.   The patient does not have symptoms concerning for COVID-19 infection (fever, chills, cough, or new SHORTNESS OF BREATH).    Prior CV studies:   The following studies were reviewed today:  Lexiscan done in April 2019 showed no evidence of ischemia, echocardiogram from April 2019 showed ejection fraction 50 to 55% trivial AI intra-atrial septal aneurysm.  Those were reviewed with the patient     Past Medical History:  Diagnosis Date  . Acute on chronic diastolic congestive heart failure ([place]) 12/27/2016  . Benign essential hypertension 01/05/2016   Last Assessment & Plan:  At goal controlled continue same medications DASH diet reinforced  . 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 Admission note, discharge summary and instructions have been reviewed.  Patient admitted for headache, and found to have a left subacute subdural hematoma. Patient status post  Surgical intervention, prior to reversal of pradaxa. Patient doing very good, no complications from procedure. Patient will have follow up  appointment with neurosurgery on 8/14 for   . Chronic kidney disease, stage III (moderate) 05/07/2016   Last Assessment & Plan:  Stable,  monitoring,  . COPD (chronic obstructive pulmonary disease) ([place]) 01/05/2016   Last Assessment & Plan:  Never smoked in the past nor present, but she has been  diagnosed with COPD. Restarted home dose PRN Albuterol. Last Assessment & Plan:  Never smoked in the past nor present, but she has been diagnosed with COPD. Restarted home dose PRN Albuterol.  [name] GERD (gastroesophageal reflux disease) 01/05/2016   Last Assessment & Plan:  Restarted on home dose of PPI Last Assessment & Plan:  Restarted on home dose of PPI  . Heart failure with preserved ejection fraction ([place]) 12/27/2016  . Microcytic anemia 01/05/2016  . Subdural hemorrhage ([place]) 12/25/2016   Last Assessment & Plan:  Patient has been doing well since her discharge from the hospital and since her recent surgery on 12/27/2016.  She denies all neurologic complaints.  She states that the right-sided weakness that she was experiencing prior to surgery has completely resolved.  She denies any headaches at this time.  She denies any issues with loss of consciousness or seizure-like activity.   . Uncomplicated opioid dependence ([place]) 07/24/2016   Last Assessment & Plan:  Referring to [facility] program On hydrocodone daily PRN, Last Prescription filled 07/24/16. No drug seeking behavior, sign of abuse or misuse       Current Meds  Medication Sig  . albuterol (PROVENTIL HFA;VENTOLIN HFA) 108 (90 Base) MCG/ACT inhaler Inhale 2 puffs into the lungs.  [name] aspirin EC 81 MG tablet Take 1 tablet (81 mg total) by mouth daily.  [name] atorvastatin (LIPITOR) 20 MG tablet Take 1 tablet by mouth daily.  . carvedilol (COREG) 12.5 MG tablet Take 1 tablet by mouth 2 (two) times daily.  . cetirizine (ZYRTEC) 10 MG tablet Take 1 tablet by mouth daily.  . Cholecalciferol (VITAMIN D-1000 MAX ST) 1000 units tablet Take 2,000 Units by mouth 2 (two) times daily.  . digoxin (LANOXIN) 0.125 MG tablet Take 1 tablet (125 mcg total) by mouth every other day.  . docusate sodium (COLACE) 100 MG capsule Take 1 capsule by mouth as needed.  . ferrous sulfate 325 (65 FE) MG tablet Take 1 tablet by mouth daily.  . fluticasone (FLONASE) 50 MCG/ACT  nasal spray Place 1 spray into the Mcguire as needed.   . furosemide (LASIX) 20 MG tablet Take 1 tablet by mouth 2 (two) times daily.  [name] lisinopril (PRINIVIL,ZESTRIL) 20 MG tablet Take 1 tablet by mouth daily.  . Multiple Vitamins-Minerals (MULTIVITAMIN ADULT PO) Take 1 tablet by mouth daily.  . pantoprazole (PROTONIX) 20 MG tablet Take 1 tablet by mouth daily.  . polyvinyl alcohol (LIQUIFILM TEARS) 1.4 % ophthalmic solution 2 drops as needed.  . potassium chloride (K-DUR) 10 MEQ tablet Take 1 tablet by mouth daily.      Family History: The patient's family history includes Heart disease in her father, mother, and sister; Hypertension in her mother and sister; Stroke in her mother.   ROS:   Please see the history of present illness.     All other systems reviewed and are negative.   Labs/Other Tests and Data Reviewed:     Recent Labs: 07/23/2018: Hemoglobin 13.4  Recent Lipid Panel No results found for: CHOL, TRIG, HDL, CHOLHDL, VLDL, LDLCALC, LDLDIRECT    Exam:    Vital Signs:  BP 117/71   Pulse 71   Ht 5'  5" (1.651 m)   Wt 253 lb (114.8 kg)   BMI 42.10 kg/m     Wt Readings from Last 3 Encounters:  07/22/19 253 lb (114.8 kg)  12/08/18 242 lb 6.4 oz (110 kg)  07/23/18 237 lb 1.9 oz (107.6 kg)     Well nourished, well developed in no acute distress. Alert awake and x3 talking to me over the phone.  She was unable to establish video link therefore we do only phone conversation.  Not in any distress and overall doing well.  Diagnosis for this visit:   1. Coronary artery disease involving native coronary artery of native heart without angina pectoris   2. Benign essential hypertension   3. Chronic heart failure with preserved ejection fraction (HCC)   4. Subdural hemorrhage (HCC)   5. Mixed hyperlipidemia      ASSESSMENT & PLAN:    1.  For artery disease stable from that point review denies having the symptoms.  We will continue present management. 2.  Benign  essential hypertension blood pressure appears to be well controlled we will continue present management. 3.  Chronic congestive heart failure.  Echocardiogram will be done to assess left ventricle ejection fraction. 4.  History of subdural hematoma that required intervention.  That is the main reason why she is not anticoagulated, will maintain her on present management.  She does have watchman device. 5.  Mixed dyslipidemia.  Will call primary care physician to get her fasting lipid profile.  COVID-19 Education: The signs and symptoms of COVID-19 were discussed with the patient and how to seek care for testing (follow up with PCP or arrange E-visit).  The importance of social distancing was discussed today.  Patient Risk:   After full review of this patients clinical status, I feel that they are at least moderate risk at this time.  Time:   Today, I have spent 5 minutes with the patient with telehealth technology discussing pt health issues.  I spent 20 minutes reviewing her chart before the visit.  Visit was finished at 8:44 AM.    Medication Adjustments/Labs and Tests Ordered: Current medicines are reviewed at length with the patient today.  Concerns regarding medicines are outlined above.  No orders of the defined types were placed in this encounter.  Medication changes: No orders of the defined types were placed in this encounter.    Disposition: Follow-up in 6 months  Signed, Park Liter, MD, [facility] 07/22/2019 8:42 AM    [place]

## 2019-07-22 NOTE — Addendum Note (Signed)
Addended by: Linna Hoff R on: 07/22/2019 09:00 AM   Modules accepted: Orders

## 2019-07-22 NOTE — Telephone Encounter (Signed)
Left message for patient to return call regarding virtual appointment she had with Dr. Agustin Cree today.

## 2019-07-22 NOTE — Patient Instructions (Signed)
Medication Instructions:  Your physician recommends that you continue on your current medications as directed. Please refer to the Current Medication list given to you today.  *If you need a refill on your cardiac medications before your next appointment, please call your pharmacy*  Lab Work: None.  If you have labs (blood work) drawn today and your tests are completely normal, you will receive your results only by: [name] MyChart Message (if you have MyChart) OR . A paper copy in the mail If you have any lab test that is abnormal or we need to change your treatment, we will call you to review the results.  Testing/Procedures: Your physician has requested that you have an echocardiogram. Echocardiography is a painless test that uses sound waves to create images of your heart. It provides your doctor with information about the size and shape of your heart and how well your heart's chambers and valves are working. This procedure takes approximately one hour. There are no restrictions for this procedure.    Follow-Up: At [facility], you and your health needs are our priority.  As part of our continuing mission to provide you with exceptional heart care, we have created designated Provider Care Teams.  These Care Teams include your primary Cardiologist (physician) and Advanced Practice Providers (APPs -  Physician Assistants and Nurse Practitioners) who all work together to provide you with the care you need, when you need it.  Your next appointment:   6 month(s)  The format for your next appointment:   In Person  Provider:   Jenne Campus, MD  Other Instructions   Echocardiogram An echocardiogram is a procedure that uses painless sound waves (ultrasound) to produce an image of the heart. Images from an echocardiogram can provide important information about:  Signs of coronary artery disease (CAD).  Aneurysm detection. An aneurysm is a weak or damaged part of an artery wall that  bulges out from the normal force of blood pumping through the body.  Heart size and shape. Changes in the size or shape of the heart can be associated with certain conditions, including heart failure, aneurysm, and CAD.  Heart muscle function.  Heart valve function.  Signs of a past heart attack.  Fluid buildup around the heart.  Thickening of the heart muscle.  A tumor or infectious growth around the heart valves. Tell a health care provider about:  Any allergies you have.  All medicines you are taking, including vitamins, herbs, eye drops, creams, and over-the-counter medicines.  Any blood disorders you have.  Any surgeries you have had.  Any medical conditions you have.  Whether you are pregnant or may be pregnant. What are the risks? Generally, this is a safe procedure. However, problems may occur, including:  Allergic reaction to dye (contrast) that may be used during the procedure. What happens before the procedure? No specific preparation is needed. You may eat and drink normally. What happens during the procedure?   An IV tube may be inserted into one of your veins.  You may receive contrast through this tube. A contrast is an injection that improves the quality of the pictures from your heart.  A gel will be applied to your chest.  A wand-like tool (transducer) will be moved over your chest. The gel will help to transmit the sound waves from the transducer.  The sound waves will harmlessly bounce off of your heart to allow the heart images to be captured in real-time motion. The images will be recorded  on a computer. The procedure may vary among health care providers and hospitals. What happens after the procedure?  You may return to your normal, everyday life, including diet, activities, and medicines, unless your health care provider tells you not to do that. Summary  An echocardiogram is a procedure that uses painless sound waves (ultrasound) to produce  an image of the heart.  Images from an echocardiogram can provide important information about the size and shape of your heart, heart muscle function, heart valve function, and fluid buildup around your heart.  You do not need to do anything to prepare before this procedure. You may eat and drink normally.  After the echocardiogram is completed, you may return to your normal, everyday life, unless your health care provider tells you not to do that. This information is not intended to replace advice given to you by your health care provider. Make sure you discuss any questions you have with your health care provider. Document Revised: 09/10/2018 Document Reviewed: 06/22/2016 Elsevier Patient Education  [place].

## 2019-07-23 NOTE — Telephone Encounter (Signed)
Called patient back. Informed her of visit information form yesterday. She has already been scheduled for echo, and will await 6 month follow up recall.

## 2019-08-23 ENCOUNTER — Ambulatory Visit (HOSPITAL_COMMUNITY): Payer: Medicare Other | Attending: Cardiovascular Disease

## 2019-08-23 ENCOUNTER — Other Ambulatory Visit: Payer: Self-pay

## 2019-08-23 DIAGNOSIS — I2510 Atherosclerotic heart disease of native coronary artery without angina pectoris: Secondary | ICD-10-CM | POA: Diagnosis present

## 2019-08-26 ENCOUNTER — Telehealth: Payer: Self-pay | Admitting: Cardiology

## 2019-08-26 NOTE — Telephone Encounter (Signed)
I spoke with patient and reviewed echo results with her 

## 2019-08-26 NOTE — Telephone Encounter (Signed)
The patient has been notified of the Echo result and verbalized understanding.  All questions (if any) were answered. Frederik Schmidt, RN 08/26/2019 10:55 AM

## 2019-08-26 NOTE — Telephone Encounter (Signed)
New message   Patient is returning call for echo results. Please call. 

## 2019-08-26 NOTE — Telephone Encounter (Signed)
Forwarded to [facility] Triage

## 2020-01-12 ENCOUNTER — Other Ambulatory Visit: Payer: Self-pay

## 2020-01-12 ENCOUNTER — Encounter (HOSPITAL_BASED_OUTPATIENT_CLINIC_OR_DEPARTMENT_OTHER): Payer: Self-pay | Admitting: *Deleted

## 2020-01-12 ENCOUNTER — Emergency Department (HOSPITAL_BASED_OUTPATIENT_CLINIC_OR_DEPARTMENT_OTHER)
Admission: EM | Admit: 2020-01-12 | Discharge: 2020-01-13 | Disposition: A | Discharge status: Home / Self Care | Payer: Medicare Other | Attending: Emergency Medicine | Admitting: Emergency Medicine

## 2020-01-12 ENCOUNTER — Emergency Department (HOSPITAL_BASED_OUTPATIENT_CLINIC_OR_DEPARTMENT_OTHER): Payer: Medicare Other

## 2020-01-12 DIAGNOSIS — I5033 Acute on chronic diastolic (congestive) heart failure: Secondary | ICD-10-CM | POA: Diagnosis not present

## 2020-01-12 DIAGNOSIS — J1282 Pneumonia due to coronavirus disease 2019: Secondary | ICD-10-CM | POA: Diagnosis not present

## 2020-01-12 DIAGNOSIS — I130 Hypertensive heart and chronic kidney disease with heart failure and stage 1 through stage 4 chronic kidney disease, or unspecified chronic kidney disease: Secondary | ICD-10-CM | POA: Insufficient documentation

## 2020-01-12 DIAGNOSIS — U071 COVID-19: Secondary | ICD-10-CM | POA: Insufficient documentation

## 2020-01-12 DIAGNOSIS — J449 Chronic obstructive pulmonary disease, unspecified: Secondary | ICD-10-CM | POA: Diagnosis not present

## 2020-01-12 DIAGNOSIS — Z7951 Long term (current) use of inhaled steroids: Secondary | ICD-10-CM | POA: Diagnosis not present

## 2020-01-12 DIAGNOSIS — N183 Chronic kidney disease, stage 3 unspecified: Secondary | ICD-10-CM | POA: Diagnosis not present

## 2020-01-12 DIAGNOSIS — R509 Fever, unspecified: Secondary | ICD-10-CM | POA: Diagnosis present

## 2020-01-12 DIAGNOSIS — Z79899 Other long term (current) drug therapy: Secondary | ICD-10-CM | POA: Diagnosis not present

## 2020-01-12 LAB — URINALYSIS, ROUTINE W REFLEX MICROSCOPIC
Bilirubin Urine: NEGATIVE
Glucose, UA: NEGATIVE mg/dL
Hgb urine dipstick: NEGATIVE
Ketones, ur: NEGATIVE mg/dL
Leukocytes,Ua: NEGATIVE
Nitrite: NEGATIVE
Protein, ur: NEGATIVE mg/dL
Specific Gravity, Urine: 1.01 (ref 1.005–1.030)
pH: 6 (ref 5.0–8.0)

## 2020-01-12 LAB — SARS CORONAVIRUS 2 BY RT PCR (HOSPITAL ORDER, PERFORMED IN ~~LOC~~ HOSPITAL LAB): SARS Coronavirus 2: POSITIVE — AB

## 2020-01-12 NOTE — ED Notes (Signed)
EDP at bedside  

## 2020-01-12 NOTE — Discharge Instructions (Signed)
We will contact the monoclonal antibody infusion clinic to see if you are a candidate for outpatient infusions.  You will be contacted regarding this.

## 2020-01-12 NOTE — ED Notes (Signed)
Critical results received; Covid positive; Dr Tegeler aware.

## 2020-01-12 NOTE — ED Triage Notes (Signed)
C/o cough fever and generalized fatigue x 1 week

## 2020-01-12 NOTE — ED Provider Notes (Signed)
[facility] Provider Note: Georgena Spurling, MD, FACEP  CSN: [id number] MRN: [id number] ARRIVAL: 01/12/20 at [place]: Kristina Mcguire  Fever   HISTORY OF PRESENT ILLNESS  01/12/20 11:35 PM Kristina Mcguire is a 79 y.o. female with 1 week of cough, fever and generalized fatigue.  Symptoms waxed and waned for the first several days of her illness but are improving.  She is here this evening because her daughter wanted her "checked out".  She had been vaccinated for Covid several months ago.  She has not had significant shortness of breath with this.  She has had some abdominal pain which is unrelated but she had to miss her gastroenterology appointment due to her illness.  She has chronic edema of the lower extremities.   Past Medical History:  Diagnosis Date  . Acute on chronic diastolic congestive heart failure ([place]) 12/27/2016  . Benign essential hypertension 01/05/2016   Last Assessment & Plan:  At goal controlled continue same medications DASH diet reinforced  . Chronic intracranial subdural hematoma ([place]) 12/26/2016   Last Assessment & Plan:  Patient for TCM. Admission note, discharge summary and instructions have been reviewed.  Patient admitted for headache, and found to have a left subacute subdural hematoma. Patient status post  Surgical intervention, prior to reversal of pradaxa. Patient doing very good, no complications from procedure. Patient will have follow up appointment with neurosurgery on 8/14 for   . Chronic kidney disease, stage III (moderate) 05/07/2016   Last Assessment & Plan:  Stable,  monitoring,  . COPD (chronic obstructive pulmonary disease) ([place]) 01/05/2016   Last Assessment & Plan:  Never smoked in the past nor present, but she has been diagnosed with COPD. Restarted home dose PRN Albuterol. Last Assessment & Plan:  Never smoked in the past nor present, but she has been diagnosed with COPD. Restarted home dose PRN Albuterol.  [name] GERD  (gastroesophageal reflux disease) 01/05/2016   Last Assessment & Plan:  Restarted on home dose of PPI Last Assessment & Plan:  Restarted on home dose of PPI  . Heart failure with preserved ejection fraction ([place]) 12/27/2016  . Microcytic anemia 01/05/2016  . Subdural hemorrhage ([place]) 12/25/2016   Last Assessment & Plan:  Patient has been doing well since her discharge from the hospital and since her recent surgery on 12/27/2016.  She denies all neurologic complaints.  She states that the right-sided weakness that she was experiencing prior to surgery has completely resolved.  She denies any headaches at this time.  She denies any issues with loss of consciousness or seizure-like activity.   . Uncomplicated opioid dependence ([place]) 07/24/2016   Last Assessment & Plan:  Referring to [facility] program On hydrocodone daily PRN, Last Prescription filled 07/24/16. No drug seeking behavior, sign of abuse or misuse    Past Surgical History:  Procedure Laterality Date  . ABDOMINAL HYSTERECTOMY    . BURR HOLE FOR SUBDURAL HEMATOMA    . EYE SURGERY    . TUBAL LIGATION      Family History  Problem Relation Age of Onset  . Heart disease Mother   . Hypertension Mother   . Stroke Mother   . Heart disease Father   . Heart disease Sister   . Hypertension Sister     Social History   Tobacco Use  . Smoking status: Never Smoker  . Smokeless tobacco: Never Used  Vaping Use  . Vaping Use: Never used  Substance Use Topics  .  Alcohol use: Never  . Drug use: Never    Prior to Admission medications   Medication Sig Start Date End Date Taking? Authorizing Provider  albuterol (PROVENTIL HFA;VENTOLIN HFA) 108 (90 Base) MCG/ACT inhaler Inhale 2 puffs into the lungs.    [provider]  aspirin EC 81 MG tablet Take 1 tablet (81 mg total) by mouth daily. 11/13/18   Park Liter, MD  atorvastatin (LIPITOR) 20 MG tablet Take 1 tablet by mouth daily. 02/12/17   [provider]  carvedilol  (COREG) 12.5 MG tablet Take 1 tablet by mouth 2 (two) times daily. 02/07/17   [provider]  cetirizine (ZYRTEC) 10 MG tablet Take 1 tablet by mouth daily. 11/07/16   [provider]  Cholecalciferol (VITAMIN D-1000 MAX ST) 1000 units tablet Take 2,000 Units by mouth 2 (two) times daily.    [provider]  digoxin (LANOXIN) 0.125 MG tablet Take 1 tablet (125 mcg total) by mouth every other day. 07/24/18   Park Liter, MD  docusate sodium (COLACE) 100 MG capsule Take 1 capsule by mouth as needed. 12/31/16   [provider]  ferrous sulfate 325 (65 FE) MG tablet Take 1 tablet by mouth daily.    [provider]  fluticasone (FLONASE) 50 MCG/ACT nasal spray Place 1 spray into the nose as needed.  03/19/16   [provider]  furosemide (LASIX) 20 MG tablet Take 1 tablet by mouth 2 (two) times daily. 11/07/16   [provider]  lisinopril (PRINIVIL,ZESTRIL) 20 MG tablet Take 1 tablet by mouth daily. 02/07/17   [provider]  Multiple Vitamins-Minerals (MULTIVITAMIN ADULT PO) Take 1 tablet by mouth daily.    [provider]  pantoprazole (PROTONIX) 20 MG tablet Take 1 tablet by mouth daily. 02/12/17   [provider]  polyvinyl alcohol (LIQUIFILM TEARS) 1.4 % ophthalmic solution 2 drops as needed.    [provider]  potassium chloride (K-DUR) 10 MEQ tablet Take 1 tablet by mouth daily. 03/25/17   [provider]    Allergies Penicillins and Pollen extract   REVIEW OF SYSTEMS  Negative except as noted here or in the History of Present Illness.   PHYSICAL EXAMINATION  Initial Vital Signs Blood pressure 134/79, pulse 75, resp. rate (!) 73, height 5\' 5"  (1.651 m), weight 127.9 kg, SpO2 95 %.  Examination General: Well-developed, well-nourished female in no acute distress; appearance consistent with age of record HENT: normocephalic; atraumatic Eyes: pupils equal, round and reactive to  light; extraocular muscles intact; bilateral pseudophakia Neck: supple Heart: regular rate and rhythm Lungs: clear to auscultation bilaterally Abdomen: soft; nondistended; nontender; bowel sounds present Extremities: No deformity; full range of motion; edema of the lower legs Neurologic: Awake, alert and oriented; motor function intact in all extremities and symmetric; no facial droop Skin: Warm and dry Psychiatric: Normal mood and affect   RESULTS  Summary of this visit's results, reviewed and interpreted by myself:   EKG Interpretation  Date/Time:    Ventricular Rate:    PR Interval:    QRS Duration:   QT Interval:    QTC Calculation:   R Axis:     Text Interpretation:        Laboratory Studies: Results for orders placed or performed during the hospital encounter of 01/12/20 (from the past 24 hour(s))  SARS Coronavirus 2 by RT PCR (hospital order, performed in [facility] hospital lab) Nasopharyngeal Nasopharyngeal Swab     Status: Abnormal  Collection Time: 01/12/20  8:17 PM   Specimen: Nasopharyngeal Swab  Result Value Ref Range   SARS Coronavirus 2 POSITIVE (A) NEGATIVE   Imaging Studies: DG Chest Portable 1 View  Result Date: 01/12/2020 CLINICAL DATA:  79 year old female with cough, fever, and fatigue for 1 week. EXAM: PORTABLE CHEST 1 VIEW COMPARISON:  None. FINDINGS: Portable AP semi upright view at 2034 hours. Cardiomegaly. Other mediastinal contours are within normal limits. Visualized tracheal air column is within normal limits. Normal lung volumes. Mild asymmetric patchy opacity at the right lateral lung base. Elsewhere allowing for portable technique the lungs are clear. No pneumothorax or pleural effusion. Paucity of bowel gas in the upper abdomen. No acute osseous abnormality identified. IMPRESSION: 1. Small area of patchy opacity at the right lateral lung base. In this setting consider mild or developing pneumonia. Followup PA and lateral chest X-ray is  recommended in 3-4 weeks to ensure resolution and exclude underlying malignancy. 2. Cardiomegaly. Electronically Signed   By: Genevie Ann M.D.   On: 01/12/2020 20:53    ED COURSE and MDM  Nursing notes, initial and subsequent vitals signs, including pulse oximetry, reviewed and interpreted by myself.  Vitals:   01/12/20 2012 01/12/20 2015 01/12/20 2338 01/12/20 2348  BP:  134/79  133/80  Pulse:  75  73  Resp:  (!) 73  19  Temp:   99.1 F (37.3 C)   TempSrc:  Oral Oral   SpO2:  95% 100% 99%  Weight: 127.9 kg     Height: 5\' 5"  (1.651 m)      Medications - No data to display  Patient does not appear ill enough for admission.  Her oxygen saturation is 95% on room air and she is feeling better than she had been.  We will contact the monoclonal antibody clinic to see if she is a candidate for outpatient MAB therapy.  PROCEDURES  Procedures   ED DIAGNOSES     ICD-10-CM   1. Pneumonia due to COVID-19 virus  U07.1    J12.82        Shanon Rosser, MD 01/12/20 2349

## 2020-01-13 ENCOUNTER — Other Ambulatory Visit (HOSPITAL_COMMUNITY): Payer: Self-pay | Admitting: Nurse Practitioner

## 2020-01-13 DIAGNOSIS — U071 COVID-19: Secondary | ICD-10-CM

## 2020-01-13 NOTE — Progress Notes (Signed)
I connected by phone with Kristina Mcguire on 01/13/2020 at 7:27 PM to discuss the potential use of an new treatment for mild to moderate COVID-19 viral infection in non-hospitalized patients.  This patient is a 79 y.o. female that meets the FDA criteria for Emergency Use Authorization of casirivimab\imdevimab.  Has a (+) direct SARS-CoV-2 viral test result  Has mild or moderate COVID-19   Is ? 79 years of age and weighs ? 40 kg  Is NOT hospitalized due to COVID-19  Is NOT requiring oxygen therapy or requiring an increase in baseline oxygen flow rate due to COVID-19  Is within 10 days of symptom onset  Has at least one of the high risk factor(s) for progression to severe COVID-19 and/or hospitalization as defined in EUA.  Specific high risk criteria : Older age (>/= 78 yo), obesity, heart disease   I have spoken and communicated the following to the patient or parent/caregiver:  1. FDA has authorized the emergency use of casirivimab\imdevimab for the treatment of mild to moderate COVID-19 in adults and pediatric patients with positive results of direct SARS-CoV-2 viral testing who are 8 years of age and older weighing at least 40 kg, and who are at high risk for progressing to severe COVID-19 and/or hospitalization.  2. The significant known and potential risks and benefits of casirivimab\imdevimab, and the extent to which such potential risks and benefits are unknown.  3. Information on available alternative treatments and the risks and benefits of those alternatives, including clinical trials.  4. Patients treated with casirivimab\imdevimab should continue to self-isolate and use infection control measures (e.g., wear mask, isolate, social distance, avoid sharing personal items, clean and disinfect "high touch" surfaces, and frequent handwashing) according to CDC guidelines.   5. The patient or parent/caregiver has the option to accept or refuse casirivimab\imdevimab .  After  reviewing this information with the patient, The patient agreed to proceed with receiving casirivimab\imdevimab infusion and will be provided a copy of the Fact sheet prior to receiving the infusion.  Beckey Rutter, [place], AGNP-C [phone] ([place])

## 2020-01-14 ENCOUNTER — Ambulatory Visit (HOSPITAL_COMMUNITY)
Admission: RE | Admit: 2020-01-14 | Discharge: 2020-01-14 | Disposition: A | Discharge status: Home / Self Care | Payer: Medicare Other | Source: Ambulatory Visit | Attending: Pulmonary Disease | Admitting: Pulmonary Disease

## 2020-01-14 DIAGNOSIS — Z23 Encounter for immunization: Secondary | ICD-10-CM | POA: Insufficient documentation

## 2020-01-14 DIAGNOSIS — U071 COVID-19: Secondary | ICD-10-CM | POA: Diagnosis present

## 2020-01-14 MED ORDER — SODIUM CHLORIDE 0.9 % IV SOLN
1200.0000 mg | Freq: Once | INTRAVENOUS | Status: AC
  Administered 2020-01-14: 1200 mg via INTRAVENOUS
  Filled 2020-01-14: qty 1200

## 2020-01-14 MED ORDER — SODIUM CHLORIDE 0.9 % IV SOLN: INTRAVENOUS | Status: DC | PRN

## 2020-01-14 MED ORDER — ALBUTEROL SULFATE HFA 108 (90 BASE) MCG/ACT IN AERS: 2.0000 | INHALATION_SPRAY | Freq: Once | RESPIRATORY_TRACT | Status: DC | PRN

## 2020-01-14 MED ORDER — DIPHENHYDRAMINE HCL 50 MG/ML IJ SOLN: 50.0000 mg | Freq: Once | INTRAMUSCULAR | Status: DC | PRN

## 2020-01-14 MED ORDER — FAMOTIDINE IN NACL 20-0.9 MG/50ML-% IV SOLN: 20.0000 mg | Freq: Once | INTRAVENOUS | Status: DC | PRN

## 2020-01-14 MED ORDER — METHYLPREDNISOLONE SODIUM SUCC 125 MG IJ SOLR: 125.0000 mg | Freq: Once | INTRAMUSCULAR | Status: DC | PRN

## 2020-01-14 MED ORDER — EPINEPHRINE 0.3 MG/0.3ML IJ SOAJ: 0.3000 mg | Freq: Once | INTRAMUSCULAR | Status: DC | PRN

## 2020-01-14 NOTE — Discharge Instructions (Signed)

## 2020-01-14 NOTE — Progress Notes (Signed)
  Diagnosis: COVID-19  Physician:  Dr. Asencion Noble   Procedure: [facility] Med: casirivimab\imdevimab infusion - Provided patient with casirivimab\imdevimab fact sheet for patients, parents and caregivers prior to infusion.  Complications: No immediate complications noted.  Discharge: Discharged home   [place] 01/14/2020

## 2020-01-14 NOTE — Progress Notes (Signed)
°  Diagnosis: COVID-19  Physician:Dr Joya Gaskins  Procedure: Cov[facility] Infusion Clinic Med: casirivimab\imdevimab infusion - Provided patient with casirivimab\imdevimab fact sheet for patients, parents and caregivers prior to infusion.  Complications: No immediate complications noted.  Discharge: Discharged home   [place], [place] 01/14/2020

## 2020-03-28 ENCOUNTER — Ambulatory Visit (INDEPENDENT_AMBULATORY_CARE_PROVIDER_SITE_OTHER): Payer: Medicare Other | Admitting: Cardiology

## 2020-03-28 ENCOUNTER — Other Ambulatory Visit: Payer: Self-pay

## 2020-03-28 ENCOUNTER — Encounter: Payer: Self-pay | Admitting: Cardiology

## 2020-03-28 VITALS — BP 138/82 | HR 66 | Ht 65.0 in | Wt 251.0 lb

## 2020-03-28 DIAGNOSIS — I10 Essential (primary) hypertension: Secondary | ICD-10-CM

## 2020-03-28 DIAGNOSIS — I2510 Atherosclerotic heart disease of native coronary artery without angina pectoris: Secondary | ICD-10-CM | POA: Diagnosis not present

## 2020-03-28 DIAGNOSIS — I480 Paroxysmal atrial fibrillation: Secondary | ICD-10-CM

## 2020-03-28 DIAGNOSIS — Z95818 Presence of other cardiac implants and grafts: Secondary | ICD-10-CM | POA: Diagnosis not present

## 2020-03-28 NOTE — Progress Notes (Signed)
Cardiology Office Note:    Date:  03/28/2020   ID:  Kristina Mcguire, DOB September 01, 1940, MRN [id number]  PCP:  Kristina Oman, DO  Cardiologist:  Kristina Campus, MD    Referring MD: Kristina Oman, DO   No chief complaint on file. I had COVID-19 infection  History of Present Illness:    Kristina Mcguire is a 79 y.o. female with past medical history significant for paroxysmal atrial fibrillation, she is not anticoagulated because of bleeding, subdural hematoma that she suffered after fall.  She does have watchman device, essential hypertension, remote coronary artery disease, dyslipidemia.  She comes today 2 months for follow-up.  Overall she is doing well few months ago she suffered from COVID-19 infection.  She was vaccinated and luckily her Covid infection was only mild, she did not require hospitalization and she recovered completely within 2 weeks after.  Now denies having any chest pain tightness squeezing pressure burning chest she walks with a cane it somewhat difficult but overall she is stable denies have any palpitations except for very rare episode of skipped beats.  Past Medical History:  Diagnosis Date   Acute on chronic diastolic congestive heart failure ([place]) 12/27/2016   Benign essential hypertension 01/05/2016   Last Assessment & Plan:  At goal controlled continue same medications DASH diet reinforced   Chronic intracranial subdural hematoma ([place]) 12/26/2016   Last Assessment & Plan:  Patient for TCM. Admission note, discharge summary and instructions have been reviewed.  Patient admitted for headache, and found to have a left subacute subdural hematoma. Patient status post  Surgical intervention, prior to reversal of pradaxa. Patient doing very good, no complications from procedure. Patient will have follow up appointment with neurosurgery on 8/14 for    Chronic kidney disease, stage III (moderate) ([place]) 05/07/2016   Last Assessment & Plan:  Stable,  monitoring,   COPD  (chronic obstructive pulmonary disease) ([place]) 01/05/2016   Last Assessment & Plan:  Never smoked in the past nor present, but she has been diagnosed with COPD. Restarted home dose PRN Albuterol. Last Assessment & Plan:  Never smoked in the past nor present, but she has been diagnosed with COPD. Restarted home dose PRN Albuterol.   GERD (gastroesophageal reflux disease) 01/05/2016   Last Assessment & Plan:  Restarted on home dose of PPI Last Assessment & Plan:  Restarted on home dose of PPI   Heart failure with preserved ejection fraction ([place]) 12/27/2016   Microcytic anemia 01/05/2016   Subdural hemorrhage ([place]) 12/25/2016   Last Assessment & Plan:  Patient has been doing well since her discharge from the hospital and since her recent surgery on 12/27/2016.  She denies all neurologic complaints.  She states that the right-sided weakness that she was experiencing prior to surgery has completely resolved.  She denies any headaches at this time.  She denies any issues with loss of consciousness or seizure-like activity.    Uncomplicated opioid dependence ([place]) 07/24/2016   Last Assessment & Plan:  Referring to [facility] program On hydrocodone daily PRN, Last Prescription filled 07/24/16. No drug seeking behavior, sign of abuse or misuse    Past Surgical History:  Procedure Laterality Date   ABDOMINAL HYSTERECTOMY     BURR HOLE FOR SUBDURAL HEMATOMA     EYE SURGERY     TUBAL LIGATION      Current Medications: Current Meds  Medication Sig   albuterol (PROVENTIL HFA;VENTOLIN HFA) 108 (90 Base) MCG/ACT inhaler Inhale 2 puffs into the  lungs.   aspirin EC 81 MG tablet Take 1 tablet (81 mg total) by mouth daily.   atorvastatin (LIPITOR) 20 MG tablet Take 1 tablet by mouth daily.   carvedilol (COREG) 12.5 MG tablet Take 1 tablet by mouth 2 (two) times daily.   cetirizine (ZYRTEC) 10 MG tablet Take 1 tablet by mouth daily.   Cholecalciferol (VITAMIN D-1000 MAX ST) 1000 units tablet Take 2,000  Units by mouth 2 (two) times daily.   diclofenac Sodium (VOLTAREN) 1 % GEL every 6 (six) hours as needed.   digoxin (LANOXIN) 0.125 MG tablet Take 1 tablet (125 mcg total) by mouth every other day.   docusate sodium (COLACE) 100 MG capsule Take 1 capsule by mouth as needed.   DULoxetine (CYMBALTA) 30 MG capsule Take 30 mg by mouth daily.   ferrous sulfate 325 (65 FE) MG tablet Take 1 tablet by mouth daily.   fluticasone (FLONASE) 50 MCG/ACT nasal spray Place 1 spray into the nose as needed.    furosemide (LASIX) 20 MG tablet Take 1 tablet by mouth 2 (two) times daily.   lisinopril (PRINIVIL,ZESTRIL) 20 MG tablet Take 1 tablet by mouth daily.   Multiple Vitamins-Minerals (MULTIVITAMIN ADULT PO) Take 1 tablet by mouth daily.   pantoprazole (PROTONIX) 20 MG tablet Take 1 tablet by mouth daily.   polyvinyl alcohol (LIQUIFILM TEARS) 1.4 % ophthalmic solution 2 drops as needed.   potassium chloride (K-DUR) 10 MEQ tablet Take 1 tablet by mouth daily.     Allergies:   Penicillins and Pollen extract   Social History   Socioeconomic History   Marital status: Single    Spouse name: Not on file   Number of children: Not on file   Years of education: Not on file   Highest education level: Not on file  Occupational History   Not on file  Tobacco Use   Smoking status: Never Smoker   Smokeless tobacco: Never Used  Vaping Use   Vaping Use: Never used  Substance and Sexual Activity   Alcohol use: Never   Drug use: Never   Sexual activity: Not on file  Other Topics Concern   Not on file  Social History Narrative   Not on file   Social Determinants of Health   Financial Resource Strain:    Difficulty of Paying Living Expenses: Not on file  Food Insecurity:    Worried About [place] in the Last Year: Not on file   Ran Out of Food in the Last Year: Not on file  Transportation Needs:    Lack of Transportation (Medical): Not on file   Lack of  Transportation (Non-Medical): Not on file  Physical Activity:    Days of Exercise per Week: Not on file   Minutes of Exercise per Session: Not on file  Stress:    Feeling of Stress : Not on file  Social Connections:    Frequency of Communication with Friends and Family: Not on file   Frequency of Social Gatherings with Friends and Family: Not on file   Attends Religious Services: Not on file   Active Member of Clubs or Organizations: Not on file   Attends Archivist Meetings: Not on file   Marital Status: Not on file     Family History: The patient's family history includes Heart disease in her father, mother, and sister; Hypertension in her mother and sister; Stroke in her mother. ROS:   Please see the history of present illness.  All 14 point review of systems negative except as described per history of present illness  EKGs/Labs/Other Studies Reviewed:    Today's EKG revealed normal sinus rhythm, first-degree AV block, evidence of right ventricle hypertrophy, nonspecific ST segment changes Recent Labs: No results found for requested labs within last 8760 hours.  Recent Lipid Panel No results found for: CHOL, TRIG, HDL, CHOLHDL, VLDL, LDLCALC, LDLDIRECT  Physical Exam:    VS:  BP 138/82    Pulse 66    Ht 5\' 5"  (1.651 m)    Wt 251 lb (113.9 kg)    SpO2 97%    BMI 41.77 kg/m     Wt Readings from Last 3 Encounters:  03/28/20 251 lb (113.9 kg)  01/12/20 282 lb (127.9 kg)  07/22/19 253 lb (114.8 kg)     GEN:  Well nourished, well developed in no acute distress HEENT: Normal NECK: No JVD; No carotid bruits LYMPHATICS: No lymphadenopathy CARDIAC: RRR, no murmurs, no rubs, no gallops RESPIRATORY:  Clear to auscultation without rales, wheezing or rhonchi  ABDOMEN: Soft, non-tender, non-distended MUSCULOSKELETAL:  No edema; No deformity  SKIN: Warm and dry LOWER EXTREMITIES: no swelling NEUROLOGIC:  Alert and oriented x 3 PSYCHIATRIC:  Normal affect    ASSESSMENT:    1. Paroxysmal atrial fibrillation (HCC)   2. Coronary artery disease involving native coronary artery of native heart without angina pectoris   3. Benign essential hypertension   4. Presence of Watchman left atrial appendage closure device    PLAN:    In order of problems listed above:  1. Paroxysmal atrial fibrillation: Maintaining sinus rhythm.  We will continue present medication which include AV blocking agent carvedilol 12.5 twice daily.  He is she is not anticoagulated because of frequent falls and eventually she ended up having subdural hematoma, however, she does have watchman device. 2. Remote coronary artery disease stable. 3. Benign essential hypertension blood pressure well controlled continue present management. 4. Presence of watchman device, noted.  She is on aspirin. 5. Recent history of COVID-19 infection.  I did review ER physician note.  Luckily she had only very mild course of the disease and I suspect that is because of the fact that she is being vaccinated.   Medication Adjustments/Labs and Tests Ordered: Current medicines are reviewed at length with the patient today.  Concerns regarding medicines are outlined above.  No orders of the defined types were placed in this encounter.  Medication changes: No orders of the defined types were placed in this encounter.   Signed, Park Liter, MD, [facility] 03/28/2020 2:36 PM    [place]

## 2020-03-28 NOTE — Patient Instructions (Signed)

## 2020-04-24 DIAGNOSIS — D1771 Benign lipomatous neoplasm of kidney: Secondary | ICD-10-CM | POA: Insufficient documentation

## 2020-04-24 HISTORY — DX: Benign lipomatous neoplasm of kidney: D17.71

## 2020-09-25 ENCOUNTER — Ambulatory Visit: Payer: Medicare Other | Admitting: Cardiology

## 2020-09-27 ENCOUNTER — Other Ambulatory Visit: Payer: Self-pay

## 2020-09-29 ENCOUNTER — Encounter: Payer: Self-pay | Admitting: Cardiology

## 2020-09-29 ENCOUNTER — Ambulatory Visit (INDEPENDENT_AMBULATORY_CARE_PROVIDER_SITE_OTHER): Payer: Medicare Other | Admitting: Cardiology

## 2020-09-29 ENCOUNTER — Other Ambulatory Visit: Payer: Self-pay

## 2020-09-29 VITALS — BP 128/70 | HR 74 | Ht 65.0 in | Wt 249.0 lb

## 2020-09-29 DIAGNOSIS — I480 Paroxysmal atrial fibrillation: Secondary | ICD-10-CM | POA: Diagnosis not present

## 2020-09-29 DIAGNOSIS — I2510 Atherosclerotic heart disease of native coronary artery without angina pectoris: Secondary | ICD-10-CM | POA: Diagnosis not present

## 2020-09-29 DIAGNOSIS — I10 Essential (primary) hypertension: Secondary | ICD-10-CM | POA: Diagnosis not present

## 2020-09-29 DIAGNOSIS — I6200 Nontraumatic subdural hemorrhage, unspecified: Secondary | ICD-10-CM | POA: Diagnosis not present

## 2020-09-29 DIAGNOSIS — Z95818 Presence of other cardiac implants and grafts: Secondary | ICD-10-CM

## 2020-09-29 NOTE — Patient Instructions (Signed)

## 2020-09-29 NOTE — Progress Notes (Signed)
Cardiology Office Note:    Date:  09/29/2020   ID:  Kristina Mcguire, DOB Mar 03, 1941, MRN [id number]  PCP:  Francesca Oman, DO  Cardiologist:  Jenne Campus, MD    Referring MD: Francesca Oman, DO   Chief Complaint  Patient presents with  . Follow-up  I am doing fine  History of Present Illness:    Kristina Mcguire is a 80 y.o. female with past medical history significant for paroxysmal atrial fibrillation, she is not anticoagulated in spite of high CHA2DS2-VASc was secondary to history of falls and subdural hematoma.  She does have watchman device.  Past medical history is also significant for essential hypertension, remote coronary artery disease, dyslipidemia. She comes today 2 months of follow-up overall she is doing well denies any chest pain tightness squeezing pressure in chest.  Trying to walk and try to be active and she has no difficulty doing it overall she seems to be doing well.  Past Medical History:  Diagnosis Date  . Acute on chronic diastolic congestive heart failure ([place]) 12/27/2016  . Benign essential hypertension 01/05/2016   Last Assessment & Plan:  At goal controlled continue same medications DASH diet reinforced  . Chronic intracranial subdural hematoma ([place]) 12/26/2016   Last Assessment & Plan:  Patient for TCM. Admission note, discharge summary and instructions have been reviewed.  Patient admitted for headache, and found to have a left subacute subdural hematoma. Patient status post  Surgical intervention, prior to reversal of pradaxa. Patient doing very good, no complications from procedure. Patient will have follow up appointment with neurosurgery on 8/14 for   . Chronic kidney disease, stage III (moderate) (HCC) 05/07/2016   Last Assessment & Plan:  Stable,  monitoring,  . Chronic midline low back pain without sciatica 05/07/2016   Last Assessment & Plan:  Formatting of this note might be different from the original. Stable, lorcet prescription renewed, last  prescription issued 10/2016 for 90 tablets  . Class 3 severe obesity due to excess calories with serious comorbidity and body mass index (BMI) of 40.0 to 44.9 in adult [facility]) 08/14/2017   Last Assessment & Plan:  Advised to start lifestyle changes: increasing physical activity at least 30 minutes 5 times a week, or as tolerated. Discussed low fat, low carb, small portion diet. Last Assessment & Plan:  Advised to start lifestyle changes: increasing physical activity at least 30 minutes 5 times a week, or as tolerated. Discussed low fat, low carb, small portion diet.  Formatting of t  . COPD (chronic obstructive pulmonary disease) ([place]) 01/05/2016   Last Assessment & Plan:  Never smoked in the past nor present, but she has been diagnosed with COPD. Restarted home dose PRN Albuterol. Last Assessment & Plan:  Never smoked in the past nor present, but she has been diagnosed with COPD. Restarted home dose PRN Albuterol.  . Coronary artery disease 09/17/2017   Myocardial infarction in 48s  Formatting of this note might be different from the original. Overview:  Myocardial infarction in 70s  . GERD (gastroesophageal reflux disease) 01/05/2016   Last Assessment & Plan:  Restarted on home dose of PPI Last Assessment & Plan:  Restarted on home dose of PPI  . Heart failure with preserved ejection fraction ([place]) 12/27/2016  . History of burr hole surgery 02/04/2017   Last Assessment & Plan:  Vital signs are stable.  This is a very pleasant 80 y.o.  who is status post left frontal parietal occipital bur holes  for evacuation of chronic subdural hematoma by Dr. Guinevere Ferrari on 12/27/2016.  Patient states symptoms experienced prior to surgery have improved.  Patient denies vision changes, tinnitus, dysphagia, dysarthria, seizure activity, or loss of consciousness since   . History of subdural hematoma 12/25/2016   Formatting of this note might be different from the original. Overview:  Last Assessment & Plan:  Patient has been doing well  since her discharge from the hospital and since her recent surgery on 12/27/2016.  She denies all neurologic complaints.  She states that the right-sided weakness that she was experiencing prior to surgery has completely resolved.  She denies any headaches at this time.  Sh  . Microcytic anemia 01/05/2016  . Mixed hyperlipidemia 12/08/2018  . Osteopenia of multiple sites 05/07/2016   Last Assessment & Plan:  Formatting of this note might be different from the original. Severe as per last BMD  . Paroxysmal atrial fibrillation ([place]) 01/05/2016   Last Assessment & Plan:  Patient is rate controlled, sinus rhythm. On beta blocker, off anticoagulation and antiplatelets since contraindicated at this time due to recent subdural hematoma. Last Assessment & Plan:  Patient is rate controlled, sinus rhythm. On beta blocker, off anticoagulation and antiplatelets since contraindicated at this time due to recent subdural hematoma.  Formatting of this   . Presence of Watchman left atrial appendage closure device 03/17/2018   Done [facility] by Dr. Denman George in summer 2019  . 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 Anti-K Formatting of this note might be different from the original. Anti-K  . 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 Shortness of breath 10/31/2016   Last Assessment & Plan:  Likely from poor physical fitness given the obesity or could be from the COPD that she is currently being treated for.  Otherwise, previous echocardiogram showed normal left ventricular systolic function.  As such, a cardiac etiology is less likely.  Recommendation:  No cardiac intervention is needed at the moment.  She should continue the COPD treatment and follow up with  . Subdural hemorrhage ([place]) 12/25/2016   Last Assessment & Plan:  Patient has been doing well since her discharge from the hospital and since her recent surgery on 12/27/2016.  She denies all neurologic  complaints.  She states that the right-sided weakness that she was experiencing prior to surgery has completely resolved.  She denies any headaches at this time.  She denies any issues with loss of consciousness or seizure-like activity.   . Uncomplicated opioid dependence ([place]) 07/24/2016   Last Assessment & Plan:  Referring to [facility] program On hydrocodone daily PRN, Last Prescription filled 07/24/16. No drug seeking behavior, sign of abuse or misuse    Past Surgical History:  Procedure Laterality Date  . ABDOMINAL HYSTERECTOMY    . BURR HOLE FOR SUBDURAL HEMATOMA    . EYE SURGERY    . TUBAL LIGATION      Current Medications: Current Meds  Medication Sig  . albuterol (PROVENTIL HFA;VENTOLIN HFA) 108 (90 Base) MCG/ACT inhaler Inhale 2 puffs into the lungs daily.  [name] aspirin EC 81 MG tablet Take 1 tablet (81 mg total) by mouth daily.  [name] atorvastatin (LIPITOR) 20 MG tablet Take 1 tablet by mouth daily.  . carvedilol (COREG) 12.5 MG tablet Take 1 tablet by mouth 2 (two) times daily.  . cetirizine (ZYRTEC) 10 MG tablet Take 1 tablet by mouth daily.  . Cholecalciferol 25 MCG (1000 UT)  tablet Take 2,000 Units by mouth 2 (two) times daily.  . diclofenac Sodium (VOLTAREN) 1 % GEL Apply 2 g topically every 6 (six) hours as needed (arthritis).  [name] digoxin (LANOXIN) 0.125 MG tablet Take 1 tablet (125 mcg total) by mouth every other day.  . docusate sodium (COLACE) 100 MG capsule Take 1 capsule by mouth as needed for mild constipation or moderate constipation.  . DULoxetine (CYMBALTA) 30 MG capsule Take 30 mg by mouth daily.  . ferrous sulfate 325 (65 FE) MG tablet Take 1 tablet by mouth daily.  . fluticasone (FLONASE) 50 MCG/ACT nasal spray Place 1 spray into the nose as needed for allergies.  . furosemide (LASIX) 20 MG tablet Take 1 tablet by mouth 2 (two) times daily.  [name] lisinopril (PRINIVIL,ZESTRIL) 20 MG tablet Take 1 tablet by mouth daily.  . Multiple Vitamins-Minerals (MULTIVITAMIN ADULT PO)  Take 1 tablet by mouth daily. Unknown strength  . pantoprazole (PROTONIX) 20 MG tablet Take 1 tablet by mouth daily.  . polyvinyl alcohol (LIQUIFILM TEARS) 1.4 % ophthalmic solution Place 2 drops into both eyes as needed for dry eyes.  . potassium chloride (K-DUR) 10 MEQ tablet Take 1 tablet by mouth daily.  . [DISCONTINUED] clopidogrel (PLAVIX) 75 MG tablet Take 1 tablet by mouth daily.     Allergies:   Penicillins and Pollen extract   Social History   Socioeconomic History  . Marital status: Single    Spouse name: Not on file  . Number of children: Not on file  . Years of education: Not on file  . Highest education level: Not on file  Occupational History  . Not on file  Tobacco Use  . Smoking status: Never Smoker  . Smokeless tobacco: Never Used  Vaping Use  . Vaping Use: Never used  Substance and Sexual Activity  . Alcohol use: Never  . Drug use: Never  . Sexual activity: Not on file  Other Topics Concern  . Not on file  Social History Narrative  . Not on file   Social Determinants of Health   Financial Resource Strain: Not on file  Food Insecurity: Not on file  Transportation Needs: Not on file  Physical Activity: Not on file  Stress: Not on file  Social Connections: Not on file     Family History: The patient's family history includes Heart disease in her father, mother, and sister; Hypertension in her mother and sister; Stroke in her mother. ROS:   Please see the history of present illness.    All 14 point review of systems negative except as described per history of present illness  EKGs/Labs/Other Studies Reviewed:      Recent Labs: No results found for requested labs within last 8760 hours.  Recent Lipid Panel No results found for: CHOL, TRIG, HDL, CHOLHDL, VLDL, LDLCALC, LDLDIRECT  Physical Exam:    VS:  BP 128/70 (BP Location: Right Arm, Patient Position: Sitting)   Pulse 74   Ht 5\' 5"  (1.651 m)   Wt 249 lb (112.9 kg)   SpO2 96%   BMI 41.44  kg/m     Wt Readings from Last 3 Encounters:  09/29/20 249 lb (112.9 kg)  03/28/20 251 lb (113.9 kg)  01/12/20 282 lb (127.9 kg)     GEN:  Well nourished, well developed in no acute distress HEENT: Normal NECK: No JVD; No carotid bruits LYMPHATICS: No lymphadenopathy CARDIAC: RRR, no murmurs, no rubs, no gallops RESPIRATORY:  Clear to auscultation without rales, wheezing or  rhonchi  ABDOMEN: Soft, non-tender, non-distended MUSCULOSKELETAL:  No edema; No deformity  SKIN: Warm and dry LOWER EXTREMITIES: no swelling NEUROLOGIC:  Alert and oriented x 3 PSYCHIATRIC:  Normal affect   ASSESSMENT:    1. Paroxysmal atrial fibrillation (HCC)   2. Coronary artery disease involving native coronary artery of native heart without angina pectoris   3. Benign essential hypertension   4. Subdural hemorrhage ([place])   5. Presence of Watchman left atrial appendage closure device    PLAN:    In order of problems listed above:  1. Paroxysmal atrial fibrillation maintaining sinus rhythm not anticoagulated but does have watchman device. 2. History of coronary artery disease but now stable without any symptoms. 3. Benign essential hypertension blood pressure well controlled continue present management. 4. History of subdural hematoma no new issues. 5. Watchman device present continue monitoring. 6. Dyslipidemia taking Lipitor 20 which I will continue.  I have no recent fasting lipid profile will call primary care physician to get a copy of   Medication Adjustments/Labs and Tests Ordered: Current medicines are reviewed at length with the patient today.  Concerns regarding medicines are outlined above.  No orders of the defined types were placed in this encounter.  Medication changes: No orders of the defined types were placed in this encounter.   Signed, Park Liter, MD, [facility] 09/29/2020 1:38 PM    [place]

## 2020-09-30 ENCOUNTER — Emergency Department (HOSPITAL_BASED_OUTPATIENT_CLINIC_OR_DEPARTMENT_OTHER)
Admission: EM | Admit: 2020-09-30 | Discharge: 2020-09-30 | Disposition: A | Discharge status: Home / Self Care | Payer: Medicare Other | Attending: Emergency Medicine | Admitting: Emergency Medicine

## 2020-09-30 ENCOUNTER — Encounter (HOSPITAL_BASED_OUTPATIENT_CLINIC_OR_DEPARTMENT_OTHER): Payer: Self-pay | Admitting: Emergency Medicine

## 2020-09-30 ENCOUNTER — Other Ambulatory Visit: Payer: Self-pay

## 2020-09-30 DIAGNOSIS — I5033 Acute on chronic diastolic (congestive) heart failure: Secondary | ICD-10-CM | POA: Insufficient documentation

## 2020-09-30 DIAGNOSIS — N183 Chronic kidney disease, stage 3 unspecified: Secondary | ICD-10-CM | POA: Insufficient documentation

## 2020-09-30 DIAGNOSIS — Z7982 Long term (current) use of aspirin: Secondary | ICD-10-CM | POA: Insufficient documentation

## 2020-09-30 DIAGNOSIS — J449 Chronic obstructive pulmonary disease, unspecified: Secondary | ICD-10-CM | POA: Diagnosis not present

## 2020-09-30 DIAGNOSIS — Z20822 Contact with and (suspected) exposure to covid-19: Secondary | ICD-10-CM | POA: Insufficient documentation

## 2020-09-30 DIAGNOSIS — I10 Essential (primary) hypertension: Secondary | ICD-10-CM

## 2020-09-30 DIAGNOSIS — R0981 Nasal congestion: Secondary | ICD-10-CM | POA: Diagnosis not present

## 2020-09-30 DIAGNOSIS — Z79899 Other long term (current) drug therapy: Secondary | ICD-10-CM | POA: Insufficient documentation

## 2020-09-30 DIAGNOSIS — Z7951 Long term (current) use of inhaled steroids: Secondary | ICD-10-CM | POA: Insufficient documentation

## 2020-09-30 DIAGNOSIS — I130 Hypertensive heart and chronic kidney disease with heart failure and stage 1 through stage 4 chronic kidney disease, or unspecified chronic kidney disease: Secondary | ICD-10-CM | POA: Diagnosis not present

## 2020-09-30 MED ORDER — LIDOCAINE VISCOUS HCL 2 % MT SOLN
15.0000 mL | Freq: Once | OROMUCOSAL | Status: AC
  Administered 2020-09-30: 15 mL via OROMUCOSAL
  Filled 2020-09-30: qty 15

## 2020-09-30 MED ORDER — IBUPROFEN 400 MG PO TABS
400.0000 mg | ORAL_TABLET | Freq: Once | ORAL | Status: DC
  Filled 2020-09-30: qty 1

## 2020-09-30 MED ORDER — ACETAMINOPHEN 500 MG PO TABS
1000.0000 mg | ORAL_TABLET | Freq: Once | ORAL | Status: AC
  Administered 2020-09-30: 1000 mg via ORAL
  Filled 2020-09-30: qty 2

## 2020-09-30 MED ORDER — AZELASTINE HCL 0.1 % NA SOLN: 2.0000 | Freq: Two times a day (BID) | NASAL | 0 refills | Status: DC

## 2020-09-30 NOTE — ED Provider Notes (Signed)
[facility] EMERGENCY DEPARTMENT Provider Note   CSN: [id number] Arrival date & time: 09/30/20  1854     History Chief Complaint  Patient presents with  . Hypertension    Kristina Mcguire is a 80 y.o. female.  The history is provided by the patient.  Hypertension This is a chronic problem. The current episode started more than 1 week ago. The problem occurs constantly. The problem has not changed since onset.Pertinent negatives include no chest pain, no abdominal pain, no headaches and no shortness of breath. Nothing aggravates the symptoms. Nothing relieves the symptoms. She has tried nothing for the symptoms. The treatment provided significant relief.  Also has nasal congestion and gum tenderness.  No CP, no SOB, no n/v/d.  No weakness no numbness.       Past Medical History:  Diagnosis Date  . Acute on chronic diastolic congestive heart failure ([place]) 12/27/2016  . Benign essential hypertension 01/05/2016   Last Assessment & Plan:  At goal controlled continue same medications DASH diet reinforced  . 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 Admission note, discharge summary and instructions have been reviewed.  Patient admitted for headache, and found to have a left subacute subdural hematoma. Patient status post  Surgical intervention, prior to reversal of pradaxa. Patient doing very good, no complications from procedure. Patient will have follow up appointment with neurosurgery on 8/14 for   . Chronic kidney disease, stage III (moderate) (HCC) 05/07/2016   Last Assessment & Plan:  Stable,  monitoring,  . Chronic midline low back pain without sciatica 05/07/2016   Last Assessment & Plan:  Formatting of this note might be different from the original. Stable, lorcet prescription renewed, last prescription issued 10/2016 for 90 tablets  . Class 3 severe obesity due to excess calories with serious comorbidity and body mass  index (BMI) of 40.0 to 44.9 in adult [facility]) 08/14/2017   Last Assessment & Plan:  Advised to start lifestyle changes: increasing physical activity at least 30 minutes 5 times a week, or as tolerated. Discussed low fat, low carb, small portion diet. Last Assessment & Plan:  Advised to start lifestyle changes: increasing physical activity at least 30 minutes 5 times a week, or as tolerated. Discussed low fat, low carb, small portion diet.  Formatting of t  . COPD (chronic obstructive pulmonary disease) ([place]) 01/05/2016   Last Assessment & Plan:  Never smoked in the past nor present, but she has been diagnosed with COPD. Restarted home dose PRN Albuterol. Last Assessment & Plan:  Never smoked in the past nor present, but she has been diagnosed with COPD. Restarted home dose PRN Albuterol.  . Coronary artery disease 09/17/2017   Myocardial infarction in 45s  Formatting of this note might be different from the original. Overview:  Myocardial infarction in 70s  . GERD (gastroesophageal reflux disease) 01/05/2016   Last Assessment & Plan:  Restarted on home dose of PPI Last Assessment & Plan:  Restarted on home dose of PPI  . Heart failure with preserved ejection fraction ([place]) 12/27/2016  . History of burr hole surgery 02/04/2017   Last Assessment & Plan:  Vital signs are stable.  This is a very pleasant 80 y.o.  who is status post left frontal parietal occipital bur holes for evacuation of chronic subdural hematoma by Dr. Guinevere Ferrari on 12/27/2016.  Patient states symptoms experienced prior to surgery have improved.  Patient denies vision changes, tinnitus, dysphagia,  dysarthria, seizure activity, or loss of consciousness since   . History of subdural hematoma 12/25/2016   Formatting of this note might be different from the original. Overview:  Last Assessment & Plan:  Patient has been doing well since her discharge from the hospital and since her recent surgery on 12/27/2016.  She denies all neurologic complaints.  She  states that the right-sided weakness that she was experiencing prior to surgery has completely resolved.  She denies any headaches at this time.  Sh  . Microcytic anemia 01/05/2016  . Mixed hyperlipidemia 12/08/2018  . Osteopenia of multiple sites 05/07/2016   Last Assessment & Plan:  Formatting of this note might be different from the original. Severe as per last BMD  . Paroxysmal atrial fibrillation ([place]) 01/05/2016   Last Assessment & Plan:  Patient is rate controlled, sinus rhythm. On beta blocker, off anticoagulation and antiplatelets since contraindicated at this time due to recent subdural hematoma. Last Assessment & Plan:  Patient is rate controlled, sinus rhythm. On beta blocker, off anticoagulation and antiplatelets since contraindicated at this time due to recent subdural hematoma.  Formatting of this   . Presence of Watchman left atrial appendage closure device 03/17/2018   Done in [facility] by Dr. Denman George in summer 2019  . 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 Anti-K Formatting of this note might be different from the original. Anti-K  . 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 Shortness of breath 10/31/2016   Last Assessment & Plan:  Likely from poor physical fitness given the obesity or could be from the COPD that she is currently being treated for.  Otherwise, previous echocardiogram showed normal left ventricular systolic function.  As such, a cardiac etiology is less likely.  Recommendation:  No cardiac intervention is needed at the moment.  She should continue the COPD treatment and follow up with  . Subdural hemorrhage ([place]) 12/25/2016   Last Assessment & Plan:  Patient has been doing well since her discharge from the hospital and since her recent surgery on 12/27/2016.  She denies all neurologic complaints.  She states that the right-sided weakness that she was experiencing prior to surgery has completely resolved.  She denies  any headaches at this time.  She denies any issues with loss of consciousness or seizure-like activity.   . Uncomplicated opioid dependence ([place]) 07/24/2016   Last Assessment & Plan:  Referring to [facility] program On hydrocodone daily PRN, Last Prescription filled 07/24/16. No drug seeking behavior, sign of abuse or misuse    Patient Active Problem List   Diagnosis Date Noted  . 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 Mixed hyperlipidemia 12/08/2018  . Presence of Watchman left atrial appendage closure device 03/17/2018  . Red blood cell antibody positive 03/05/2018  . Generalized headaches 12/17/2017  . Coronary artery disease 09/17/2017  . Class 3 severe obesity due to excess calories with serious comorbidity and body mass index (BMI) of 40.0 to 44.9 in adult ([place]) 08/14/2017  . History of burr hole surgery 02/04/2017  . Acute on chronic diastolic congestive heart failure ([place]) 12/27/2016  . Heart failure with preserved ejection fraction ([place]) 12/27/2016  . Chronic intracranial subdural hematoma (HCC) 12/26/2016  . History of subdural hematoma 12/25/2016  . Subdural hemorrhage ([place]) 12/25/2016  . Shortness of breath 10/31/2016  . Uncomplicated opioid dependence ([place]) 07/24/2016  . Chronic renal disease, stage III ([place]) 05/07/2016  . Chronic midline low back pain without  sciatica 05/07/2016  . Osteopenia of multiple sites 05/07/2016  . Benign essential hypertension 01/05/2016  . COPD (chronic obstructive pulmonary disease) ([place]) 01/05/2016  . GERD (gastroesophageal reflux disease) 01/05/2016  . Microcytic anemia 01/05/2016  . Paroxysmal atrial fibrillation ([place]) 01/05/2016    Past Surgical History:  Procedure Laterality Date  . ABDOMINAL HYSTERECTOMY    . BURR HOLE FOR SUBDURAL HEMATOMA    . EYE SURGERY    . TUBAL LIGATION       OB History   No obstetric history on file.     Family History  Problem Relation Age of Onset  . Heart disease Mother   . Hypertension Mother   .  Stroke Mother   . Heart disease Father   . Heart disease Sister   . Hypertension Sister     Social History   Tobacco Use  . Smoking status: Never Smoker  . Smokeless tobacco: Never Used  Vaping Use  . Vaping Use: Never used  Substance Use Topics  . Alcohol use: Never  . Drug use: Never    Home Medications Prior to Admission medications   Medication Sig Start Date End Date Taking? Authorizing Provider  albuterol (PROVENTIL HFA;VENTOLIN HFA) 108 (90 Base) MCG/ACT inhaler Inhale 2 puffs into the lungs daily.    [provider]  aspirin EC 81 MG tablet Take 1 tablet (81 mg total) by mouth daily. 11/13/18   Park Liter, MD  atorvastatin (LIPITOR) 20 MG tablet Take 1 tablet by mouth daily. 02/12/17   [provider]  carvedilol (COREG) 12.5 MG tablet Take 1 tablet by mouth 2 (two) times daily. 02/07/17   [provider]  cetirizine (ZYRTEC) 10 MG tablet Take 1 tablet by mouth daily. 11/07/16   [provider]  Cholecalciferol 25 MCG (1000 UT) tablet Take 2,000 Units by mouth 2 (two) times daily.    [provider]  diclofenac Sodium (VOLTAREN) 1 % GEL Apply 2 g topically every 6 (six) hours as needed (arthritis). 09/28/19   [provider]  digoxin (LANOXIN) 0.125 MG tablet Take 1 tablet (125 mcg total) by mouth every other day. 07/24/18   Park Liter, MD  docusate sodium (COLACE) 100 MG capsule Take 1 capsule by mouth as needed for mild constipation or moderate constipation. 12/31/16   [provider]  DULoxetine (CYMBALTA) 30 MG capsule Take 30 mg by mouth daily. 12/19/19   [provider]  ferrous sulfate 325 (65 FE) MG tablet Take 1 tablet by mouth daily.    [provider]  fluticasone (FLONASE) 50 MCG/ACT nasal spray Place 1 spray into the nose as needed for allergies. 03/19/16   [provider]  furosemide (LASIX) 20 MG tablet Take 1 tablet by mouth 2 (two) times daily. 11/07/16   [provider]  lisinopril (PRINIVIL,ZESTRIL) 20 MG tablet Take 1 tablet by mouth daily. 02/07/17   [provider]  Multiple Vitamins-Minerals (MULTIVITAMIN ADULT PO) Take 1 tablet by mouth daily. Unknown strength    [provider]  pantoprazole (PROTONIX) 20 MG tablet Take 1 tablet by mouth daily. 02/12/17   [provider]  polyvinyl alcohol (LIQUIFILM TEARS) 1.4 % ophthalmic solution Place 2 drops into both eyes as needed for dry eyes.    [provider]  potassium chloride (K-DUR) 10 MEQ tablet Take 1 tablet by mouth daily. 03/25/17   [provider]    Allergies    Penicillins, Nsaids, and Pollen extract  Review of  Systems   Review of Systems  Constitutional: Negative for diaphoresis and fever.  HENT: Positive for congestion. Negative for drooling, ear discharge and ear pain.   Eyes: Negative for visual disturbance.  Respiratory: Negative for shortness of breath.   Cardiovascular: Negative for chest pain.  Gastrointestinal: Negative for abdominal pain.  Genitourinary: Negative for difficulty urinating.  Musculoskeletal: Negative for arthralgias.  Skin: Negative for rash.  Neurological: Negative for headaches.  Psychiatric/Behavioral: Negative for agitation.  All other systems reviewed and are negative.   Physical Exam Updated Vital Signs BP (!) 150/72 (BP Location: Right Arm)   Pulse 70   Temp 98.3 F (36.8 C) (Oral)   Resp 20   SpO2 99%   Physical Exam Vitals and nursing note reviewed.  Constitutional:      General: She is not in acute distress.    Appearance: Normal appearance.  HENT:     Head: Normocephalic and atraumatic.     Nose: Nose normal.  Eyes:     Extraocular Movements: Extraocular movements intact.     Conjunctiva/sclera: Conjunctivae normal.     Pupils: Pupils are equal, round, and reactive to light.  Cardiovascular:     Rate and Rhythm: Normal rate and regular rhythm.     Pulses: Normal pulses.      Heart sounds: Normal heart sounds.  Pulmonary:     Effort: Pulmonary effort is normal.     Breath sounds: Normal breath sounds.  Abdominal:     General: Abdomen is flat. Bowel sounds are normal.     Palpations: Abdomen is soft.     Tenderness: There is no abdominal tenderness. There is no guarding.  Musculoskeletal:        General: Normal range of motion.     Cervical back: Normal range of motion and neck supple.  Skin:    General: Skin is warm and dry.     Capillary Refill: Capillary refill takes less than 2 seconds.  Neurological:     General: No focal deficit present.     Mental Status: She is alert and oriented to person, place, and time.     Deep Tendon Reflexes: Reflexes normal.  Psychiatric:        Mood and Affect: Mood normal.        Behavior: Behavior normal.     ED Results / Procedures / Treatments   Labs (all labs ordered are listed, but only abnormal results are displayed) Labs Reviewed  SARS CORONAVIRUS 2 (TAT 6-24 HRS)    EKG None  Radiology No results found.  Procedures Procedures   Medications Ordered in ED Medications  ibuprofen (ADVIL) tablet 400 mg (400 mg Oral Patient Refused/Not Given 09/30/20 2316)  lidocaine (XYLOCAINE) 2 % viscous mouth solution 15 mL (has no administration in time range)  acetaminophen (TYLENOL) tablet 1,000 mg (1,000 mg Oral Given 09/30/20 2312)    ED Course  I have reviewed the triage vital signs and the nursing notes.  Pertinent labs & imaging results that were available during my care of the patient were reviewed by me and considered in my medical decision making (see chart for details).   Follow up with your PMD regarding BP which has resolved and dentistry regarding gum tenderness. NO signs of bacterial sinus infection.  No drainage, no fevers,  Swabbed for covid.  Will add astelin nasal spray.    Kristina Mcguire was evaluated in Emergency Department on 09/30/2020 for the symptoms described in the history of present  illness.  She was evaluated in the context of the global COVID-19 pandemic, which necessitated consideration that the patient might be at risk for infection with the SARS-CoV-2 virus that causes COVID-19. Institutional protocols and algorithms that pertain to the evaluation of patients at risk for COVID-19 are in a state of rapid change based on information released by regulatory bodies including the CDC and federal and state organizations. These policies and algorithms were followed during the patient's care in the ED.  Final Clinical Impression(s) / ED Diagnoses Return for intractable cough, coughing up blood, fevers >100.4 unrelieved by medication, shortness of breath, intractable vomiting, chest pain, shortness of breath, weakness, numbness, changes in speech, facial asymmetry, abdominal pain, passing out, Inability to tolerate liquids or food, cough, altered mental status or any concerns. No signs of systemic illness or infection. The patient is nontoxic-appearing on exam and vital signs are within normal limits.  I have reviewed the triage vital signs and the nursing notes. Pertinent labs & imaging results that were available during my care of the patient were reviewed by me and considered in my medical decision making (see chart for details). After history, exam, and medical workup I feel the patient has been appropriately medically screened and is safe for discharge home. Pertinent diagnoses were discussed with the patient. Patient was given return precautions.   Raveen Wieseler, MD 09/30/20 2329

## 2020-09-30 NOTE — ED Triage Notes (Signed)
Pt reports high blood pressure at home 150/85 - managed at home with lisinopril. States left ear aching and gums inflamed, concerned for sinus infection - ongoing for two weeks.

## 2020-10-02 LAB — SARS CORONAVIRUS 2 (TAT 6-24 HRS): SARS Coronavirus 2: NEGATIVE

## 2021-04-04 DIAGNOSIS — R918 Other nonspecific abnormal finding of lung field: Secondary | ICD-10-CM | POA: Insufficient documentation

## 2021-04-04 DIAGNOSIS — E041 Nontoxic single thyroid nodule: Secondary | ICD-10-CM | POA: Insufficient documentation

## 2021-04-12 ENCOUNTER — Encounter: Payer: Self-pay | Admitting: Cardiology

## 2021-04-12 ENCOUNTER — Ambulatory Visit (INDEPENDENT_AMBULATORY_CARE_PROVIDER_SITE_OTHER): Payer: Medicare Other | Admitting: Cardiology

## 2021-04-12 ENCOUNTER — Other Ambulatory Visit: Payer: Self-pay

## 2021-04-12 VITALS — BP 128/70 | HR 48 | Ht 65.0 in | Wt 253.0 lb

## 2021-04-12 DIAGNOSIS — I6203 Nontraumatic chronic subdural hemorrhage: Secondary | ICD-10-CM

## 2021-04-12 DIAGNOSIS — Z23 Encounter for immunization: Secondary | ICD-10-CM | POA: Diagnosis not present

## 2021-04-12 DIAGNOSIS — I10 Essential (primary) hypertension: Secondary | ICD-10-CM | POA: Diagnosis not present

## 2021-04-12 DIAGNOSIS — I480 Paroxysmal atrial fibrillation: Secondary | ICD-10-CM

## 2021-04-12 DIAGNOSIS — I2510 Atherosclerotic heart disease of native coronary artery without angina pectoris: Secondary | ICD-10-CM

## 2021-04-12 DIAGNOSIS — I5033 Acute on chronic diastolic (congestive) heart failure: Secondary | ICD-10-CM

## 2021-04-12 DIAGNOSIS — E782 Mixed hyperlipidemia: Secondary | ICD-10-CM

## 2021-04-12 NOTE — Progress Notes (Signed)
Cardiology Office Note:    Date:  04/12/2021   ID:  Kristina Mcguire, DOB [birth year], MRN [id number]  PCP:  Francesca Oman, DO  Cardiologist:  Jenne Campus, MD    Referring MD: Francesca Oman, DO   Chief Complaint  Patient presents with   Follow-up  I am doing well  History of Present Illness:    Kristina Mcguire is a 80 y.o. female   with past medical history significant for paroxysmal atrial fibrillation, she is not anticoagulated in spite of high CHA2DS2-VASc was secondary to history of falls and subdural hematoma.  She does have watchman device.  Past medical history is also significant for essential hypertension, remote coronary artery disease, dyslipidemia. She comes today to my office for follow-up.  She seems to be doing well.  Usual shortness of breath usual mild swelling of lower extremities but otherwise seems to be doing well.  No palpitations no dizziness no chest pain tightness squeezing pressure burning chest  Past Medical History:  Diagnosis Date   Acute on chronic diastolic congestive heart failure ([place]) 12/27/2016   Benign essential hypertension 01/05/2016   Last Assessment & Plan:  At goal controlled continue same medications DASH diet reinforced   Chronic intracranial subdural hematoma ([place]) 12/26/2016   Last Assessment & Plan:  Patient for TCM. Admission note, discharge summary and instructions have been reviewed.  Patient admitted for headache, and found to have a left subacute subdural hematoma. Patient status post  Surgical intervention, prior to reversal of pradaxa. Patient doing very good, no complications from procedure. Patient will have follow up appointment with neurosurgery on 8/14 for    Chronic kidney disease, stage III (moderate) ([place]) 05/07/2016   Last Assessment & Plan:  Stable,  monitoring,   Chronic midline low back pain without sciatica 05/07/2016   Last Assessment & Plan:  Formatting of this note might be different from the original. Stable, lorcet  prescription renewed, last prescription issued 10/2016 for 90 tablets   Class 3 severe obesity due to excess calories with serious comorbidity and body mass index (BMI) of 40.0 to 44.9 in adult [facility]) 08/14/2017   Last Assessment & Plan:  Advised to start lifestyle changes: increasing physical activity at least 30 minutes 5 times a week, or as tolerated. Discussed low fat, low carb, small portion diet. Last Assessment & Plan:  Advised to start lifestyle changes: increasing physical activity at least 30 minutes 5 times a week, or as tolerated. Discussed low fat, low carb, small portion diet.  Formatting of t   COPD (chronic obstructive pulmonary disease) ([place]) 01/05/2016   Last Assessment & Plan:  Never smoked in the past nor present, but she has been diagnosed with COPD. Restarted home dose PRN Albuterol. Last Assessment & Plan:  Never smoked in the past nor present, but she has been diagnosed with COPD. Restarted home dose PRN Albuterol.   Coronary artery disease 09/17/2017   Myocardial infarction in 17s  Formatting of this note might be different from the original. Overview:  Myocardial infarction in 70s   GERD (gastroesophageal reflux disease) 01/05/2016   Last Assessment & Plan:  Restarted on home dose of PPI Last Assessment & Plan:  Restarted on home dose of PPI   Heart failure with preserved ejection fraction ([place]) 12/27/2016   History of burr hole surgery 02/04/2017   Last Assessment & Plan:  Vital signs are stable.  This is a very pleasant 80 y.o.  who is status post left frontal  parietal occipital bur holes for evacuation of chronic subdural hematoma by Dr. Guinevere Ferrari on 12/27/2016.  Patient states symptoms experienced prior to surgery have improved.  Patient denies vision changes, tinnitus, dysphagia, dysarthria, seizure activity, or loss of consciousness since    History of subdural hematoma 12/25/2016   Formatting of this note might be different from the original. Overview:  Last Assessment & Plan:  Patient  has been doing well since her discharge from the hospital and since her recent surgery on 12/27/2016.  She denies all neurologic complaints.  She states that the right-sided weakness that she was experiencing prior to surgery has completely resolved.  She denies any headaches at this time.  Sh   Microcytic anemia 01/05/2016   Mixed hyperlipidemia 12/08/2018   Osteopenia of multiple sites 05/07/2016   Last Assessment & Plan:  Formatting of this note might be different from the original. Severe as per last BMD   Paroxysmal atrial fibrillation ([place]) 01/05/2016   Last Assessment & Plan:  Patient is rate controlled, sinus rhythm. On beta blocker, off anticoagulation and antiplatelets since contraindicated at this time due to recent subdural hematoma. Last Assessment & Plan:  Patient is rate controlled, sinus rhythm. On beta blocker, off anticoagulation and antiplatelets since contraindicated at this time due to recent subdural hematoma.  Formatting of this    Presence of Watchman left atrial appendage closure device 03/17/2018   Done in [facility] by Dr. Denman George in summer 2019   Red blood cell antibody positive 03/05/2018   Formatting of this note might be different from the original. Anti-K Formatting of this note might be different from the original. 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  Otherwise, previous echocardiogram showed normal left ventricular systolic function.  As such, a cardiac etiology is less likely.  Recommendation:  No cardiac intervention is needed at the moment.  She should continue the COPD treatment and follow up with   Subdural hemorrhage ([place]) 12/25/2016   Last Assessment & Plan:  Patient has been doing well since her discharge from the hospital and since her recent surgery on 12/27/2016.  She denies all  neurologic complaints.  She states that the right-sided weakness that she was experiencing prior to surgery has completely resolved.  She denies any headaches at this time.  She denies any issues with loss of consciousness or seizure-like activity.    Uncomplicated opioid dependence ([place]) 07/24/2016   Last Assessment & Plan:  Referring to [facility] program On hydrocodone daily PRN, Last Prescription filled 07/24/16. No drug seeking behavior, sign of abuse or misuse    Past Surgical History:  Procedure Laterality Date   ABDOMINAL HYSTERECTOMY     BURR HOLE FOR SUBDURAL HEMATOMA     EYE SURGERY     TUBAL LIGATION      Current Medications: Current Meds  Medication Sig   albuterol (PROVENTIL HFA;VENTOLIN HFA) 108 (90 Base) MCG/ACT inhaler Inhale 2 puffs into the lungs daily.   aspirin EC 81 MG tablet Take 1 tablet (81 mg total) by mouth daily.   atorvastatin (LIPITOR) 20 MG tablet Take 20 mg by mouth daily.   azelastine (ASTELIN) 0.1 % nasal spray Place 2 sprays into both nostrils 2 (two) times daily. Use in each nostril as directed   carvedilol (COREG) 12.5 MG tablet Take  1 tablet by mouth 2 (two) times daily.   cetirizine (ZYRTEC) 10 MG tablet Take 1 tablet by mouth daily.   Cholecalciferol 25 MCG (1000 UT) tablet Take 2,000 Units by mouth 2 (two) times daily.   diclofenac Sodium (VOLTAREN) 1 % GEL Apply 2 g topically every 6 (six) hours as needed (arthritis).   digoxin (LANOXIN) 0.125 MG tablet Take 1 tablet (125 mcg total) by mouth every other day.   docusate sodium (COLACE) 100 MG capsule Take 1 capsule by mouth as needed for mild constipation or moderate constipation.   DULoxetine (CYMBALTA) 30 MG capsule Take 30 mg by mouth daily.   ferrous sulfate 325 (65 FE) MG tablet Take 1 tablet by mouth daily.   fluticasone (FLONASE) 50 MCG/ACT nasal spray Place 1 spray into the nose as needed for allergies.   furosemide (LASIX) 20 MG tablet Take 1 tablet by mouth 2 (two) times daily.   lisinopril  (PRINIVIL,ZESTRIL) 20 MG tablet Take 1 tablet by mouth daily.   Multiple Vitamins-Minerals (MULTIVITAMIN ADULT PO) Take 1 tablet by mouth daily. Unknown strength   pantoprazole (PROTONIX) 20 MG tablet Take 1 tablet by mouth daily.   polyvinyl alcohol (LIQUIFILM TEARS) 1.4 % ophthalmic solution Place 2 drops into both eyes as needed for dry eyes.   potassium chloride (K-DUR) 10 MEQ tablet Take 1 tablet by mouth daily.     Allergies:   Penicillins, Nsaids, and Pollen extract   Social History   Socioeconomic History   Marital status: Single    Spouse name: Not on file   Number of children: Not on file   Years of education: Not on file   Highest education level: Not on file  Occupational History   Not on file  Tobacco Use   Smoking status: Never   Smokeless tobacco: Never  Vaping Use   Vaping Use: Never used  Substance and Sexual Activity   Alcohol use: Never   Drug use: Never   Sexual activity: Not on file  Other Topics Concern   Not on file  Social History Narrative   Not on file   Social Determinants of Health   Financial Resource Strain: Not on file  Food Insecurity: Not on file  Transportation Needs: Not on file  Physical Activity: Not on file  Stress: Not on file  Social Connections: Not on file     Family History: The patient's family history includes Heart disease in her father, mother, and sister; Hypertension in her mother and sister; Stroke in her mother. ROS:   Please see the history of present illness.    All 14 point review of systems negative except as described per history of present illness  EKGs/Labs/Other Studies Reviewed:      Recent Labs: No results found for requested labs within last 8760 hours.  Recent Lipid Panel No results found for: CHOL, TRIG, HDL, CHOLHDL, VLDL, LDLCALC, LDLDIRECT  Physical Exam:    VS:  BP 128/70 (BP Location: Right Arm, Patient Position: Sitting)   Pulse (!) 48   Ht 5\' 5"  (1.651 m)   Wt 253 lb (114.8 kg)    SpO2 100%   BMI 42.10 kg/m     Wt Readings from Last 3 Encounters:  04/12/21 253 lb (114.8 kg)  09/29/20 249 lb (112.9 kg)  03/28/20 251 lb (113.9 kg)     GEN:  Well nourished, well developed in no acute distress HEENT: Normal NECK: No JVD; No carotid bruits LYMPHATICS: No lymphadenopathy CARDIAC: RRR, no  murmurs, no rubs, no gallops RESPIRATORY:  Clear to auscultation without rales, wheezing or rhonchi  ABDOMEN: Soft, non-tender, non-distended MUSCULOSKELETAL:  No edema; No deformity  SKIN: Warm and dry LOWER EXTREMITIES: no swelling NEUROLOGIC:  Alert and oriented x 3 PSYCHIATRIC:  Normal affect   ASSESSMENT:    1. Paroxysmal atrial fibrillation (HCC)   2. Acute on chronic diastolic congestive heart failure ([place])   3. Need for immunization against influenza   4. Benign essential hypertension   5. Coronary artery disease involving native coronary artery of native heart without angina pectoris   6. Chronic intracranial subdural hematoma (HCC)   7. Mixed hyperlipidemia    PLAN:    In order of problems listed above:  Paroxysmal atrial fibrillation she is not anticoagulate because of subdural hematoma but she does have a watchman device denies having any palpitation overall seems to be doing well. Congestive heart failure compensated her lower extremities do not have much pitting edema her lower extremities is simply 5. Essential hypertension blood pressure seems to be well controlled continue present management. Coronary disease stable denies have any chest pain tightness squeezing pressure burning chest   Medication Adjustments/Labs and Tests Ordered: Current medicines are reviewed at length with the patient today.  Concerns regarding medicines are outlined above.  Orders Placed This Encounter  Procedures   Flu Vaccine QUAD 52mo+IM (Fluarix, Fluzone & Alfiuria Quad PF)   EKG 12-Lead   Medication changes: No orders of the defined types were placed in this  encounter.   Signed, Park Liter, MD, [facility] 04/12/2021 4:32 PM    [facility]

## 2021-04-12 NOTE — Patient Instructions (Signed)
Medication Instructions:  Your physician recommends that you continue on your current medications as directed. Please refer to the Current Medication list given to you today.  *If you need a refill on your cardiac medications before your next appointment, please call your pharmacy*   Lab Work: None If you have labs (blood work) drawn today and your tests are completely normal, you will receive your results only by: [place] (if you have MyChart) OR A paper copy in the mail If you have any lab test that is abnormal or we need to change your treatment, we will call you to review the results.   Testing/Procedures: None    Follow-Up: At [facility], you and your health needs are our priority.  As part of our continuing mission to provide you with exceptional heart care, we have created designated Provider Care Teams.  These Care Teams include your primary Cardiologist (physician) and Advanced Practice Providers (APPs -  Physician Assistants and Nurse Practitioners) who all work together to provide you with the care you need, when you need it.  We recommend signing up for the patient portal called "MyChart".  Sign up information is provided on this After Visit Summary.  MyChart is used to connect with patients for Virtual Visits (Telemedicine).  Patients are able to view lab/test results, encounter notes, upcoming appointments, etc.  Non-urgent messages can be sent to your provider as well.   To learn more about what you can do with MyChart, go to [email or web address].    Your next appointment:   2 Months The format for your next appointment:   In Person  Provider:   Jenne Campus, MD    Other Instructions

## 2021-05-01 DIAGNOSIS — E042 Nontoxic multinodular goiter: Secondary | ICD-10-CM | POA: Insufficient documentation

## 2021-05-01 DIAGNOSIS — Z9109 Other allergy status, other than to drugs and biological substances: Secondary | ICD-10-CM | POA: Insufficient documentation

## 2021-05-03 ENCOUNTER — Telehealth: Payer: Self-pay | Admitting: Cardiology

## 2021-05-03 NOTE — Telephone Encounter (Signed)
Pt c/o medication issue:  1. Name of Medication: aspirin EC 81 MG tablet  2. How are you currently taking this medication (dosage and times per day)? 1 tablet daily  3. Are you having a reaction (difficulty breathing--STAT)? no  4. What is your medication issue? Patient states she is getting a biopsy and was told to hold her aspirin for 10 days. She states the office is not requesting clearance, the patient just wants to know herself if it is okay to hold the aspirin.

## 2021-05-04 NOTE — Telephone Encounter (Signed)
Patient aware Dr. Agustin Cree said she can stop her aspirin for 7-10 days before biopsy. No further questions.

## 2021-05-04 NOTE — Telephone Encounter (Signed)
Patient reports the biopsy is for a nodule of her thyroid.

## 2021-07-03 ENCOUNTER — Encounter (HOSPITAL_BASED_OUTPATIENT_CLINIC_OR_DEPARTMENT_OTHER): Payer: Self-pay

## 2021-07-03 ENCOUNTER — Emergency Department (HOSPITAL_BASED_OUTPATIENT_CLINIC_OR_DEPARTMENT_OTHER)
Admission: EM | Admit: 2021-07-03 | Discharge: 2021-07-03 | Disposition: A | Discharge status: Home / Self Care | Payer: Medicare Other | Attending: Emergency Medicine | Admitting: Emergency Medicine

## 2021-07-03 ENCOUNTER — Other Ambulatory Visit: Payer: Self-pay

## 2021-07-03 ENCOUNTER — Emergency Department (HOSPITAL_BASED_OUTPATIENT_CLINIC_OR_DEPARTMENT_OTHER): Payer: Medicare Other

## 2021-07-03 DIAGNOSIS — Z7982 Long term (current) use of aspirin: Secondary | ICD-10-CM | POA: Diagnosis not present

## 2021-07-03 DIAGNOSIS — M79605 Pain in left leg: Secondary | ICD-10-CM

## 2021-07-03 DIAGNOSIS — M7122 Synovial cyst of popliteal space [Baker], left knee: Secondary | ICD-10-CM

## 2021-07-03 MED ORDER — HYDROCODONE-ACETAMINOPHEN 5-325 MG PO TABS
2.0000 | ORAL_TABLET | Freq: Once | ORAL | Status: AC
  Administered 2021-07-03: 2 via ORAL
  Filled 2021-07-03: qty 2

## 2021-07-03 MED ORDER — HYDROCODONE-ACETAMINOPHEN 5-325 MG PO TABS: 1.0000 | ORAL_TABLET | ORAL | 0 refills | Status: DC | PRN

## 2021-07-03 NOTE — Discharge Instructions (Signed)
Your ultrasound shows a Baker's cyst, which is a cyst behind your knee.  This could be causing your pain.  Be sure to apply heat and ice to the area and elevate the leg.  If you develop fever, new or worsening pain, inability to walk, or any other new/concerning symptoms and return to the ER for evaluation.

## 2021-07-03 NOTE — ED Provider Notes (Signed)
[facility] EMERGENCY DEPARTMENT Provider Note   CSN: [id number] Arrival date & time: 07/03/21  1702     History  Chief Complaint  Patient presents with   Leg Pain    ARDYS Kristina Mcguire is a 81 y.o. female.  HPI 81 year old female presents with 2 days of left leg pain.  She states this seemed to start in her ankle and now is diffuse in her entire left leg.  She feels like her knee and ankle are swollen.  No fevers or abnormal urination color.  She has a previous history of a blood clot per her from several years ago but had a head bleed and was taken off of anticoagulation.  She has taken Tylenol with no relief.  She is having a hard time walking because of the pain.  She does not remember any injury but it for started when she got up out of a chair.   Home Medications Prior to Admission medications   Medication Sig Start Date End Date Taking? Authorizing Provider  albuterol (PROVENTIL HFA;VENTOLIN HFA) 108 (90 Base) MCG/ACT inhaler Inhale 2 puffs into the lungs daily.    [provider]  aspirin EC 81 MG tablet Take 1 tablet (81 mg total) by mouth daily. 11/13/18   Park Liter, MD  atorvastatin (LIPITOR) 20 MG tablet Take 20 mg by mouth daily. 02/12/17   [provider]  azelastine (ASTELIN) 0.1 % nasal spray Place 2 sprays into both nostrils 2 (two) times daily. Use in each nostril as directed 09/30/20   Palumbo, April, MD  carvedilol (COREG) 12.5 MG tablet Take 1 tablet by mouth 2 (two) times daily. 02/07/17   [provider]  cetirizine (ZYRTEC) 10 MG tablet Take 1 tablet by mouth daily. 11/07/16   [provider]  Cholecalciferol 25 MCG (1000 UT) tablet Take 2,000 Units by mouth 2 (two) times daily.    [provider]  diclofenac Sodium (VOLTAREN) 1 % GEL Apply 2 g topically every 6 (six) hours as needed (arthritis). 09/28/19   [provider]  digoxin (LANOXIN) 0.125 MG tablet Take 1 tablet (125 mcg total) by mouth  every other day. 07/24/18   Park Liter, MD  docusate sodium (COLACE) 100 MG capsule Take 1 capsule by mouth as needed for mild constipation or moderate constipation. 12/31/16   [provider]  DULoxetine (CYMBALTA) 30 MG capsule Take 30 mg by mouth daily. 12/19/19   [provider]  ferrous sulfate 325 (65 FE) MG tablet Take 1 tablet by mouth daily.    [provider]  fluticasone (FLONASE) 50 MCG/ACT nasal spray Place 1 spray into the nose as needed for allergies. 03/19/16   [provider]  furosemide (LASIX) 20 MG tablet Take 1 tablet by mouth 2 (two) times daily. 11/07/16   [provider]  HYDROcodone-acetaminophen (NORCO) 5-325 MG tablet Take 1 tablet by mouth every 4 (four) hours as needed. 07/03/21   Sherwood Gambler, MD  lisinopril (PRINIVIL,ZESTRIL) 20 MG tablet Take 1 tablet by mouth daily. 02/07/17   [provider]  Multiple Vitamins-Minerals (MULTIVITAMIN ADULT PO) Take 1 tablet by mouth daily. Unknown strength    [provider]  pantoprazole (PROTONIX) 20 MG tablet Take 1 tablet by mouth daily. 02/12/17   [provider]  polyvinyl alcohol (LIQUIFILM TEARS) 1.4 % ophthalmic solution Place 2 drops into both eyes as needed for dry eyes.    [provider]  potassium chloride (K-DUR) 10 MEQ tablet  Take 1 tablet by mouth daily. 03/25/17   [provider]      Allergies    Penicillins, Nsaids, and Pollen extract    Review of Systems   Review of Systems  Constitutional:  Negative for fever.  Cardiovascular:  Positive for leg swelling.  Musculoskeletal:  Positive for arthralgias.  Neurological:  Negative for numbness.   Physical Exam Updated Vital Signs BP (!) 168/70    Pulse 83    Temp 98.4 F (36.9 C) (Oral)    Resp 18    SpO2 99%  Physical Exam Vitals and nursing note reviewed.  Constitutional:      General: She is not in acute distress.    Appearance: She is well-developed. She is  obese.  HENT:     Head: Normocephalic and atraumatic.  Cardiovascular:     Rate and Rhythm: Normal rate and regular rhythm.     Pulses:          Posterior tibial pulses are detected w/ Doppler on the left side.  Pulmonary:     Effort: Pulmonary effort is normal.  Musculoskeletal:     Comments: Limited exam due to patient's morbid obesity.  However her left leg is diffusely tender.  This excludes the foot which appears fairly normal.  I see no skin color changes or erythema in her left lower extremity.  However any movement of her left leg including bending at the knee and the hip causes pain.  Any lifting of her leg causes pain.  Skin:    General: Skin is warm and dry.  Neurological:     Mental Status: She is alert.    ED Results / Procedures / Treatments   Labs (all labs ordered are listed, but only abnormal results are displayed) Labs Reviewed - No data to display  EKG None  Radiology DG Pelvis 1-2 Views  Result Date: 07/03/2021 CLINICAL DATA:  Left leg pain EXAM: PELVIS - 1 VIEW; LEFT FEMUR 2 VIEWS; LEFT TIBIA AND FIBULA - 2 VIEW COMPARISON:  None. FINDINGS: There is no evidence of pelvic fracture or diastasis. No pelvic bone lesions are seen. Soft tissues are unremarkable. No acute fracture of the left femur or left tibia and fibula. Soft tissues are unremarkable. 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 IMPRESSION: 1. No acute osseous abnormality. 2. 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 Electronically Signed   By: Yetta Glassman M.D.   On: 07/03/2021 19:42   DG Tibia/Fibula Left  Result Date: 07/03/2021 CLINICAL DATA:  Left leg pain EXAM: PELVIS - 1 VIEW; LEFT FEMUR 2 VIEWS; LEFT TIBIA AND FIBULA - 2 VIEW COMPARISON:  None. FINDINGS: There is no evidence of pelvic fracture or diastasis. No pelvic bone lesions are seen. Soft tissues are unremarkable. No acute fracture of the left  femur or left tibia and fibula. Soft tissues are unremarkable. 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 IMPRESSION: 1. No acute osseous abnormality. 2. 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 Electronically Signed   By: Yetta Glassman M.D.   On: 07/03/2021 19:42   US Venous Img Lower Unilateral Left  Result Date: 07/03/2021 CLINICAL DATA:  left leg pain EXAM: Left LOWER EXTREMITY VENOUS DOPPLER ULTRASOUND TECHNIQUE: Gray-scale sonography with compression, as well as color and duplex ultrasound, were performed to evaluate the deep venous system(s) from the level of the common  femoral vein through the popliteal and proximal calf veins. COMPARISON:  None. FINDINGS: VENOUS Normal compressibility of the common femoral, superficial femoral, and popliteal veins, as well as the visualized calf veins. Visualized portions of profunda femoral vein and great saphenous vein unremarkable. No filling defects to suggest DVT on grayscale or color Doppler imaging. Doppler waveforms show normal direction of venous flow, normal respiratory plasticity and response to augmentation. Limited views of the contralateral common femoral vein are unremarkable. OTHER There is a complex appearing 6.1 cm cystic structure within the popliteal fossa with associated intralesional multi septation. No associated vascularity. Limitations: none IMPRESSION: 1. No deep venous thrombosis of the left lower extremity. 2. Complex 6.1 cm popliteal fossa cystic lesion that may represent a hematoma versus Baker's cyst. Electronically Signed   By: Iven Finn M.D.   On: 07/03/2021 20:01   DG Femur Min 2 Views Left  Result Date: 07/03/2021 CLINICAL DATA:  Left leg pain EXAM: PELVIS - 1 VIEW; LEFT FEMUR 2 VIEWS; LEFT TIBIA AND FIBULA - 2 VIEW COMPARISON:  None. FINDINGS: There is no evidence of pelvic fracture or diastasis. No pelvic bone lesions are seen.  Soft tissues are unremarkable. No acute fracture of the left femur or left tibia and fibula. Soft tissues are unremarkable. 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 IMPRESSION: 1. No acute osseous abnormality. 2. 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 Electronically Signed   By: Yetta Glassman M.D.   On: 07/03/2021 19:42    Procedures Procedures    Medications Ordered in ED Medications  HYDROcodone-acetaminophen (NORCO/VICODIN) 5-325 MG per tablet 2 tablet (2 tablets Oral Given 07/03/21 1859)    ED Course/ Medical Decision Making/ A&P                           Medical Decision Making Amount and/or Complexity of Data Reviewed Radiology: ordered. ECG/medicine tests: ordered.  Risk Prescription drug management.   Overall, patient is feeling better after being given oral hydrocodone and is able to ambulate with a walker, which she has at home.  She is limping but able to bear weight.  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  The ultrasound to rule out DVT shows no DVT but does show a complex cystic collection, hematoma versus Baker's cyst.  She reports no trauma.  At this point, I doubt septic joint and think she is stable for discharge home with a short course of pain medicines and follow-up with her orthopedist.  She appears stable for discharge home.  She is neurovascularly intact.        Final Clinical Impression(s) / ED Diagnoses Final diagnoses:  Left leg pain  Synovial cyst of left popliteal space    Rx / DC Orders ED Discharge Orders          Ordered    HYDROcodone-acetaminophen (NORCO) 5-325 MG tablet  Every 4 hours PRN,   Status:  Discontinued        07/03/21 2027    HYDROcodone-acetaminophen (NORCO) 5-325 MG tablet  Every 4 hours PRN        07/03/21 2027               Sherwood Gambler, MD 07/03/21 2335

## 2021-07-03 NOTE — ED Triage Notes (Signed)
Pt to triage via w/c via [facility]-c/o pain to entire left LE-started 1/29-denies injury-NAD

## 2021-07-03 NOTE — ED Notes (Signed)
Pt ambulatory with walker in hallway with slow steady gait, states pain has improved after meds. States has a walker at home she can use.

## 2021-07-04 ENCOUNTER — Ambulatory Visit: Payer: Medicare Other | Admitting: Cardiology

## 2021-07-24 ENCOUNTER — Encounter: Payer: Self-pay | Admitting: Cardiology

## 2021-07-24 ENCOUNTER — Other Ambulatory Visit: Payer: Self-pay

## 2021-07-24 ENCOUNTER — Ambulatory Visit (INDEPENDENT_AMBULATORY_CARE_PROVIDER_SITE_OTHER): Payer: Medicare Other | Admitting: Cardiology

## 2021-07-24 VITALS — BP 128/82 | HR 62 | Ht 65.0 in | Wt 247.1 lb

## 2021-07-24 DIAGNOSIS — I5032 Chronic diastolic (congestive) heart failure: Secondary | ICD-10-CM | POA: Diagnosis not present

## 2021-07-24 DIAGNOSIS — I10 Essential (primary) hypertension: Secondary | ICD-10-CM | POA: Diagnosis not present

## 2021-07-24 DIAGNOSIS — I6200 Nontraumatic subdural hemorrhage, unspecified: Secondary | ICD-10-CM

## 2021-07-24 DIAGNOSIS — I480 Paroxysmal atrial fibrillation: Secondary | ICD-10-CM | POA: Diagnosis not present

## 2021-07-24 NOTE — Progress Notes (Signed)
Cardiology Office Note:    Date:  07/24/2021   ID:  Kristina Mcguire, DOB Oct 17, 1940, MRN [id number]  PCP:  Francesca Oman, DO  Cardiologist:  Jenne Campus, MD    Referring MD: Francesca Oman, DO   No chief complaint on file. Doing fine  History of Present Illness:    Kristina Mcguire is a 81 y.o. female with past medical history significant for paroxysmal atrial fibrillation, she is not anticoagulated in spite of high CHA2DS2-VASc was secondary to history of falls and subdural hematoma.  She does have watchman device.  Past medical history is also significant for essential hypertension, remote coronary artery disease, dyslipidemia. Comes today for muscle follow-up she did have a problem left leg going to the emergency room.  Left leg is painful when she got some steroids injection with good response.  Cardiac wise described to have a lot of palpitations but no sustained arrhythmia.  No chest pain tightness squeezing pressure burning chest.  Shortness of breath is as usual.  In spite of probably like she still trying to be active and exercise.  Past Medical History:  Diagnosis Date   Acute on chronic diastolic congestive heart failure ([place]) 12/27/2016   Benign essential hypertension 01/05/2016   Last Assessment & Plan:  At goal controlled continue same medications DASH diet reinforced   Chronic intracranial subdural hematoma ([place]) 12/26/2016   Last Assessment & Plan:  Patient for TCM. Admission note, discharge summary and instructions have been reviewed.  Patient admitted for headache, and found to have a left subacute subdural hematoma. Patient status post  Surgical intervention, prior to reversal of pradaxa. Patient doing very good, no complications from procedure. Patient will have follow up appointment with neurosurgery on 8/14 for    Chronic kidney disease, stage III (moderate) ([place]) 05/07/2016   Last Assessment & Plan:  Stable,  monitoring,   Chronic midline low back pain without  sciatica 05/07/2016   Last Assessment & Plan:  Formatting of this note might be different from the original. Stable, lorcet prescription renewed, last prescription issued 10/2016 for 90 tablets   Class 3 severe obesity due to excess calories with serious comorbidity and body mass index (BMI) of 40.0 to 44.9 in adult [facility]) 08/14/2017   Last Assessment & Plan:  Advised to start lifestyle changes: increasing physical activity at least 30 minutes 5 times a week, or as tolerated. Discussed low fat, low carb, small portion diet. Last Assessment & Plan:  Advised to start lifestyle changes: increasing physical activity at least 30 minutes 5 times a week, or as tolerated. Discussed low fat, low carb, small portion diet.  Formatting of t   COPD (chronic obstructive pulmonary disease) ([place]) 01/05/2016   Last Assessment & Plan:  Never smoked in the past nor present, but she has been diagnosed with COPD. Restarted home dose PRN Albuterol. Last Assessment & Plan:  Never smoked in the past nor present, but she has been diagnosed with COPD. Restarted home dose PRN Albuterol.   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 Overview:  Myocardial infarction in 70s   GERD (gastroesophageal reflux disease) 01/05/2016   Last Assessment & Plan:  Restarted on home dose of PPI Last Assessment & Plan:  Restarted on home dose of PPI   Heart failure with preserved ejection fraction ([place]) 12/27/2016   History of burr hole surgery 02/04/2017   Last Assessment & Plan:  Vital signs are stable.  This is a very pleasant 81 y.o.  who is status post left frontal parietal occipital bur holes for evacuation of chronic subdural hematoma by Dr. Guinevere Ferrari on 12/27/2016.  Patient states symptoms experienced prior to surgery have improved.  Patient denies vision changes, tinnitus, dysphagia, dysarthria, seizure activity, or loss of consciousness since    History of subdural  hematoma 12/25/2016   Formatting of this note might be different from the original. Overview:  Last Assessment & Plan:  Patient has been doing well since her discharge from the hospital and since her recent surgery on 12/27/2016.  She denies all neurologic complaints.  She states that the right-sided weakness that she was experiencing prior to surgery has completely resolved.  She denies any headaches at this time.  Sh   Microcytic anemia 01/05/2016   Mixed hyperlipidemia 12/08/2018   Osteopenia of multiple sites 05/07/2016   Last Assessment & Plan:  Formatting of this note might be different from the original. Severe as per last BMD   Paroxysmal atrial fibrillation ([place]) 01/05/2016   Last Assessment & Plan:  Patient is rate controlled, sinus rhythm. On beta blocker, off anticoagulation and antiplatelets since contraindicated at this time due to recent subdural hematoma. Last Assessment & Plan:  Patient is rate controlled, sinus rhythm. On beta blocker, off anticoagulation and antiplatelets since contraindicated at this time due to recent subdural hematoma.  Formatting of this    Presence of Watchman left atrial appendage closure device 03/17/2018   Done in [facility] by Dr. Denman George in summer 2019   Red blood cell antibody positive 03/05/2018   Formatting of this note might be different from the original. Anti-K Formatting of this note might be different from the original. 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  Otherwise, previous echocardiogram showed normal left ventricular systolic function.  As such, a cardiac etiology is less likely.  Recommendation:  No cardiac intervention is needed at the moment.  She should continue the COPD treatment and follow up with   Subdural hemorrhage ([place]) 12/25/2016   Last Assessment & Plan:   Patient has been doing well since her discharge from the hospital and since her recent surgery on 12/27/2016.  She denies all neurologic complaints.  She states that the right-sided weakness that she was experiencing prior to surgery has completely resolved.  She denies any headaches at this time.  She denies any issues with loss of consciousness or seizure-like activity.    Uncomplicated opioid dependence ([place]) 07/24/2016   Last Assessment & Plan:  Referring to [facility] program On hydrocodone daily PRN, Last Prescription filled 07/24/16. No drug seeking behavior, sign of abuse or misuse    Past Surgical History:  Procedure Laterality Date   ABDOMINAL HYSTERECTOMY     BURR HOLE FOR SUBDURAL HEMATOMA     EYE SURGERY     TUBAL LIGATION      Current Medications: Current Meds  Medication Sig   albuterol (PROVENTIL HFA;VENTOLIN HFA) 108 (90 Base) MCG/ACT inhaler Inhale 2 puffs into the lungs every 6 (six) hours as needed for wheezing or shortness of breath.   aspirin EC 81 MG tablet Take 1 tablet (81 mg total) by mouth daily.   atorvastatin (LIPITOR) 20 MG tablet Take 20 mg by mouth daily.  azelastine (ASTELIN) 0.1 % nasal spray Place 2 sprays into both nostrils 2 (two) times daily. Use in each nostril as directed   carvedilol (COREG) 12.5 MG tablet Take 1 tablet by mouth 2 (two) times daily.   cetirizine (ZYRTEC) 10 MG tablet Take 1 tablet by mouth daily.   Cholecalciferol 25 MCG (1000 UT) tablet Take 2,000 Units by mouth 2 (two) times daily.   diclofenac Sodium (VOLTAREN) 1 % GEL Apply 2 g topically every 6 (six) hours as needed (arthritis).   digoxin (LANOXIN) 0.125 MG tablet Take 1 tablet (125 mcg total) by mouth every other day.   docusate sodium (COLACE) 100 MG capsule Take 1 capsule by mouth as needed for mild constipation or moderate constipation.   DULoxetine (CYMBALTA) 30 MG capsule Take 30 mg by mouth daily.   ferrous sulfate 325 (65 FE) MG tablet Take 1 tablet by mouth daily.    fluticasone (FLONASE) 50 MCG/ACT nasal spray Place 1 spray into the nose as needed for allergies.   furosemide (LASIX) 20 MG tablet Take 1 tablet by mouth 2 (two) times daily.   lisinopril (PRINIVIL,ZESTRIL) 20 MG tablet Take 1 tablet by mouth daily.   Multiple Vitamins-Minerals (MULTIVITAMIN ADULT PO) Take 1 tablet by mouth daily. Unknown strength   pantoprazole (PROTONIX) 20 MG tablet Take 1 tablet by mouth daily.   polyvinyl alcohol (LIQUIFILM TEARS) 1.4 % ophthalmic solution Place 2 drops into both eyes as needed for dry eyes.   potassium chloride (K-DUR) 10 MEQ tablet Take 1 tablet by mouth daily.     Allergies:   Penicillins, Nsaids, and Pollen extract   Social History   Socioeconomic History   Marital status: Single    Spouse name: Not on file   Number of children: Not on file   Years of education: Not on file   Highest education level: Not on file  Occupational History   Not on file  Tobacco Use   Smoking status: Never    Passive exposure: Never   Smokeless tobacco: Never  Vaping Use   Vaping Use: Never used  Substance and Sexual Activity   Alcohol use: Never   Drug use: Never   Sexual activity: Not on file  Other Topics Concern   Not on file  Social History Narrative   Not on file   Social Determinants of Health   Financial Resource Strain: Not on file  Food Insecurity: Not on file  Transportation Needs: Not on file  Physical Activity: Not on file  Stress: Not on file  Social Connections: Not on file     Family History: The patient's family history includes Heart disease in her father, mother, and sister; Hypertension in her mother and sister; Stroke in her mother. ROS:   Please see the history of present illness.    All 14 point review of systems negative except as described per history of present illness  EKGs/Labs/Other Studies Reviewed:      Recent Labs: No results found for requested labs within last 8760 hours.  Recent Lipid Panel No results  found for: CHOL, TRIG, HDL, CHOLHDL, VLDL, LDLCALC, LDLDIRECT  Physical Exam:    VS:  BP 128/82 (BP Location: Left Arm)    Pulse 62    Ht 5\' 5"  (1.651 m)    Wt 247 lb 1.9 oz (112.1 kg)    SpO2 98%    BMI 41.12 kg/m     Wt Readings from Last 3 Encounters:  07/24/21 247 lb 1.9 oz (112.1  kg)  04/12/21 253 lb (114.8 kg)  09/29/20 249 lb (112.9 kg)     GEN:  Well nourished, well developed in no acute distress HEENT: Normal NECK: No JVD; No carotid bruits LYMPHATICS: No lymphadenopathy CARDIAC: RRR, no murmurs, no rubs, no gallops RESPIRATORY:  Clear to auscultation without rales, wheezing or rhonchi  ABDOMEN: Soft, non-tender, non-distended MUSCULOSKELETAL:  No edema; No deformity  SKIN: Warm and dry LOWER EXTREMITIES: no swelling NEUROLOGIC:  Alert and oriented x 3 PSYCHIATRIC:  Normal affect   ASSESSMENT:    1. Benign essential hypertension   2. Chronic heart failure with preserved ejection fraction (HCC)   3. Paroxysmal atrial fibrillation (HCC)   4. Subdural hemorrhage (HCC)    PLAN:    In order of problems listed above:  Benign essential hypertension blood pressure well controlled we will continue present management. Chronic congestive heart failure which is diastolic in nature seems to be compensated continue present management. Paroxysmal atrial fibrillation maintaining sinus rhythm today.  She is not anticoagulated because of subdural hematoma.  She does have however Watchman device History of subdural hematoma after being already taking care of Dyslipidemia, I did review data from her primary care physician which showed LDL of 52 HDL 52 as well good cholesterol control we will continue present management which include moderate intensity statin.   Medication Adjustments/Labs and Tests Ordered: Current medicines are reviewed at length with the patient today.  Concerns regarding medicines are outlined above.  No orders of the defined types were placed in this  encounter.  Medication changes: No orders of the defined types were placed in this encounter.   Signed, Park Liter, MD, [facility] 07/24/2021 1:35 PM    [facility]

## 2021-07-24 NOTE — Patient Instructions (Signed)

## 2021-08-09 LAB — COLOGUARD: COLOGUARD: NEGATIVE

## 2021-09-18 ENCOUNTER — Other Ambulatory Visit: Payer: Self-pay

## 2021-09-18 ENCOUNTER — Telehealth: Payer: Self-pay | Admitting: Cardiology

## 2021-09-18 NOTE — Telephone Encounter (Signed)
Advised there was no interaction with medications that she is on. Advised to be careful when taking as it causes dizziness. Pt verbalized understanding and had no additional qiestions. ?

## 2021-09-18 NOTE — Telephone Encounter (Signed)
Pt c/o medication issue: ? ?1. Name of Medication: cyclobenzaprine ? ?2. How are you currently taking this medication (dosage and times per day)? no ? ?3. Are you having a reaction (difficulty breathing--STAT)? no ? ?4. What is your medication issue? Patient calling in because her dr wanted to check with her heart dr to make sure it was okay for patient to take medication. Please advise  ? ?

## 2022-01-18 ENCOUNTER — Ambulatory Visit (INDEPENDENT_AMBULATORY_CARE_PROVIDER_SITE_OTHER): Payer: Medicare Other | Admitting: Cardiology

## 2022-01-18 ENCOUNTER — Encounter: Payer: Self-pay | Admitting: Cardiology

## 2022-01-18 VITALS — BP 124/80 | HR 70 | Ht 65.0 in | Wt 253.1 lb

## 2022-01-18 DIAGNOSIS — I480 Paroxysmal atrial fibrillation: Secondary | ICD-10-CM | POA: Diagnosis not present

## 2022-01-18 DIAGNOSIS — J449 Chronic obstructive pulmonary disease, unspecified: Secondary | ICD-10-CM | POA: Diagnosis not present

## 2022-01-18 DIAGNOSIS — E782 Mixed hyperlipidemia: Secondary | ICD-10-CM

## 2022-01-18 DIAGNOSIS — I10 Essential (primary) hypertension: Secondary | ICD-10-CM | POA: Diagnosis not present

## 2022-01-18 DIAGNOSIS — Z6841 Body Mass Index (BMI) 40.0 and over, adult: Secondary | ICD-10-CM

## 2022-01-18 NOTE — Progress Notes (Unsigned)
Cardiology Office Note:    Date:  01/18/2022   ID:  Kristina Mcguire, DOB 12/26/1940, MRN [id number]  PCP:  Francesca Oman, DO  Cardiologist:  Jenne Campus, MD    Referring MD: Francesca Oman, DO   No chief complaint on file. Doing fine  History of Present Illness:    Kristina Mcguire is a 81 y.o. female with past medical history significant for paroxysmal atrial fibrillation, CHADS2 vascular score is 5, she is not anticoagulated secondary to history of falls and subdural hematoma but she does have Watchman device, also essential hypertension, remote coronary artery disease, dyslipidemia. She is in my office today for follow-up doing well denies have any chest pain tightness squeezing pressure burning chest.  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 Admission note, discharge summary and instructions have been reviewed.  Patient admitted for headache, and found to have a left subacute subdural hematoma. Patient status post  Surgical intervention, prior to reversal of pradaxa. Patient doing very good, no complications from procedure. Patient will have follow up appointment with neurosurgery on 8/14 for    Chronic kidney disease, stage III (moderate) ([place]) 05/07/2016   Last Assessment & Plan:  Stable,  monitoring,   Chronic midline low back pain without sciatica 05/07/2016   Last Assessment & Plan:  Formatting of this note might be different from the  original. Stable, lorcet prescription renewed, last prescription issued 10/2016 for 90 tablets   Class 3 severe obesity due to excess calories with serious comorbidity and body mass index (BMI) of 40.0 to 44.9 in adult [facility]) 08/14/2017   Last Assessment & Plan:  Advised to start lifestyle changes: increasing physical activity at least 30 minutes 5 times a week, or as tolerated. Discussed low fat, low carb, small portion diet. Last Assessment & Plan:  Advised to start lifestyle changes: increasing physical activity at least 30 minutes 5 times a week, or as tolerated. Discussed low fat, low carb, small portion diet.  Formatting of t   COPD (chronic obstructive pulmonary disease) ([place]) 01/05/2016   Last Assessment & Plan:  Never smoked in the past nor present, but she has been diagnosed with COPD. Restarted home dose PRN Albuterol. Last Assessment & Plan:  Never smoked in the past nor present, but she has been diagnosed with COPD. Restarted home dose PRN Albuterol.   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 Overview:  Myocardial infarction in 70s   GERD (gastroesophageal reflux disease) 01/05/2016   Last Assessment & Plan:  Restarted on home dose of PPI Last Assessment & Plan:  Restarted on home dose of PPI   Heart failure with preserved ejection fraction ([place]) 12/27/2016   History of burr hole surgery 02/04/2017   Last Assessment & Plan:  Vital signs are stable.  This is a very pleasant 81 y.o.  who is status post left  frontal parietal occipital bur holes for evacuation of chronic subdural hematoma by Dr. Guinevere Ferrari on 12/27/2016.  Patient states symptoms experienced prior to surgery have improved.  Patient denies vision changes, tinnitus, dysphagia, dysarthria, seizure activity, or loss of consciousness since    History of subdural hematoma 12/25/2016   Formatting of this note might be different from the original. Overview:  Last  Assessment & Plan:  Patient has been doing well since her discharge from the hospital and since her recent surgery on 12/27/2016.  She denies all neurologic complaints.  She states that the right-sided weakness that she was experiencing prior to surgery has completely resolved.  She denies any headaches at this time.  Sh   Microcytic anemia 01/05/2016   Mixed hyperlipidemia 12/08/2018   Osteopenia of multiple sites 05/07/2016   Last Assessment & Plan:  Formatting of this note might be different from the original. Severe as per last BMD   Paroxysmal atrial fibrillation ([place]) 01/05/2016   Last Assessment & Plan:  Patient is rate controlled, sinus rhythm. On beta blocker, off anticoagulation and antiplatelets since contraindicated at this time due to recent subdural hematoma. Last Assessment & Plan:  Patient is rate controlled, sinus rhythm. On beta blocker, off anticoagulation and antiplatelets since contraindicated at this time due to recent subdural hematoma.  Formatting of this    Presence of Watchman left atrial appendage closure device 03/17/2018   Done in [facility] by Dr. Denman George in summer 2019   Red blood cell antibody positive 03/05/2018   Formatting of this note might be different from the original. Anti-K Formatting of this note might be different from the original. 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  Otherwise, previous echocardiogram showed normal left ventricular systolic function.  As such, a cardiac etiology is less likely.  Recommendation:  No cardiac intervention is needed at the moment.  She should continue the COPD treatment and follow up with   Subdural hemorrhage ([place]) 12/25/2016   Last Assessment & Plan:  Patient has been doing well since her discharge from the hospital and since her recent surgery on  12/27/2016.  She denies all neurologic complaints.  She states that the right-sided weakness that she was experiencing prior to surgery has completely resolved.  She denies any headaches at this time.  She denies any issues with loss of consciousness or seizure-like activity.    Uncomplicated opioid dependence ([place]) 07/24/2016   Last Assessment & Plan:  Referring to [facility] program On hydrocodone daily PRN, Last Prescription filled 07/24/16. No drug seeking behavior, sign of abuse or misuse    Past Surgical History:  Procedure Laterality Date   ABDOMINAL HYSTERECTOMY     BURR HOLE FOR SUBDURAL HEMATOMA     EYE SURGERY     TUBAL LIGATION      Current Medications: Current Meds  Medication Sig   albuterol (PROVENTIL HFA;VENTOLIN HFA) 108 (90 Base) MCG/ACT inhaler Inhale 2 puffs into the lungs every 6 (six) hours as needed for wheezing or shortness of breath.   aspirin EC 81 MG tablet Take 1 tablet (81 mg total) by mouth daily.   atorvastatin (LIPITOR) 20 MG tablet Take 20 mg by mouth daily.   carvedilol (COREG) 12.5 MG tablet Take 1 tablet by mouth 2 (two) times daily.   cetirizine (ZYRTEC)  10 MG tablet Take 1 tablet by mouth daily.   Cholecalciferol 25 MCG (1000 UT) tablet Take 2,000 Units by mouth 2 (two) times daily.   diclofenac Sodium (VOLTAREN) 1 % GEL Apply 2 g topically every 6 (six) hours as needed (arthritis).   digoxin (LANOXIN) 0.125 MG tablet Take 1 tablet (125 mcg total) by mouth every other day.   docusate sodium (COLACE) 100 MG capsule Take 1 capsule by mouth as needed for mild constipation or moderate constipation.   DULoxetine (CYMBALTA) 30 MG capsule Take 30 mg by mouth daily.   ferrous sulfate 325 (65 FE) MG tablet Take 1 tablet by mouth daily.   fluticasone (FLONASE) 50 MCG/ACT nasal spray Place 1 spray into the nose as needed for allergies.   furosemide (LASIX) 20 MG tablet Take 1 tablet by mouth 2 (two) times daily.   lisinopril (PRINIVIL,ZESTRIL) 20 MG tablet Take 1  tablet by mouth daily.   Multiple Vitamins-Minerals (MULTIVITAMIN ADULT PO) Take 1 tablet by mouth daily. Unknown strength   pantoprazole (PROTONIX) 20 MG tablet Take 1 tablet by mouth daily.   polyvinyl alcohol (LIQUIFILM TEARS) 1.4 % ophthalmic solution Place 2 drops into both eyes as needed for dry eyes.   potassium chloride (K-DUR) 10 MEQ tablet Take 1 tablet by mouth daily.     Allergies:   Penicillins, Nsaids, and Pollen extract   Social History   Socioeconomic History   Marital status: Single    Spouse name: Not on file   Number of children: Not on file   Years of education: Not on file   Highest education level: Not on file  Occupational History   Not on file  Tobacco Use   Smoking status: Never    Passive exposure: Never   Smokeless tobacco: Never  Vaping Use   Vaping Use: Never used  Substance and Sexual Activity   Alcohol use: Never   Drug use: Never   Sexual activity: Not on file  Other Topics Concern   Not on file  Social History Narrative   Not on file   Social Determinants of Health   Financial Resource Strain: Not on file  Food Insecurity: Not on file  Transportation Needs: Not on file  Physical Activity: Not on file  Stress: Not on file  Social Connections: Not on file     Family History: The patient's family history includes Heart disease in her father, mother, and sister; Hypertension in her mother and sister; Stroke in her mother. ROS:   Please see the history of present illness.    All 14 point review of systems negative except as described per history of present illness  EKGs/Labs/Other Studies Reviewed:      Recent Labs: No results found for requested labs within last 365 days.  Recent Lipid Panel No results found for: "CHOL", "TRIG", "HDL", "CHOLHDL", "VLDL", "LDLCALC", "LDLDIRECT"  Physical Exam:    VS:  BP 124/80 (BP Location: Right Wrist, Patient Position: Sitting)   Pulse 70   Ht '5\' 5"'$  (1.651 m)   Wt 253 lb 1.9 oz (114.8 kg)    SpO2 96%   BMI 42.12 kg/m     Wt Readings from Last 3 Encounters:  01/18/22 253 lb 1.9 oz (114.8 kg)  07/24/21 247 lb 1.9 oz (112.1 kg)  04/12/21 253 lb (114.8 kg)     GEN:  Well nourished, well developed in no acute distress HEENT: Normal NECK: No JVD; No carotid bruits LYMPHATICS: No lymphadenopathy CARDIAC: RRR, no murmurs,  no rubs, no gallops RESPIRATORY:  Clear to auscultation without rales, wheezing or rhonchi  ABDOMEN: Soft, non-tender, non-distended MUSCULOSKELETAL:  No edema; No deformity  SKIN: Warm and dry LOWER EXTREMITIES: no swelling NEUROLOGIC:  Alert and oriented x 3 PSYCHIATRIC:  Normal affect   ASSESSMENT:    1. Benign essential hypertension   2. Paroxysmal atrial fibrillation (HCC)   3. Chronic obstructive pulmonary disease, unspecified COPD type ([place])   4. Mixed hyperlipidemia   5. Class 3 severe obesity due to excess calories with serious comorbidity and body mass index (BMI) of 40.0 to 44.9 in adult [facility])    PLAN:    In order of problems listed above:  Essential hypertension blood pressure well controlled continue present management Paroxysmal atrial fibrillation status post Watchman device.  Described to have very short lasting palpitations.  Ask her to let me know if this palpitation became more frequent and started became better some. 3, COPD stable,  4. mixed dyslipidemia I did review K PN which show me her LDL of 52 HDL 52 excellent cholesterol control continue present management    Medication Adjustments/Labs and Tests Ordered: Current medicines are reviewed at length with the patient today.  Concerns regarding medicines are outlined above.  No orders of the defined types were placed in this encounter.  Medication changes: No orders of the defined types were placed in this encounter.   Signed, Park Liter, MD, [facility] 01/18/2022 1:50 PM    [place]

## 2022-01-18 NOTE — Patient Instructions (Signed)

## 2022-01-31 ENCOUNTER — Encounter: Payer: Self-pay | Admitting: Cardiology

## 2022-02-05 ENCOUNTER — Telehealth: Payer: Self-pay

## 2022-02-05 NOTE — Telephone Encounter (Signed)
Spoke with pt. She stated that her HR has been normal rhythm and her blood pressures have been good. Encourage her to cal and come in for an EKG if she has any abnormal HR or rhythm. Pt agreed and verbalized understanding and had no further questions.

## 2022-05-21 DIAGNOSIS — K4090 Unilateral inguinal hernia, without obstruction or gangrene, not specified as recurrent: Secondary | ICD-10-CM | POA: Insufficient documentation

## 2022-05-21 DIAGNOSIS — Z6841 Body Mass Index (BMI) 40.0 and over, adult: Secondary | ICD-10-CM | POA: Insufficient documentation

## 2022-06-27 DIAGNOSIS — J0120 Acute ethmoidal sinusitis, unspecified: Secondary | ICD-10-CM | POA: Insufficient documentation

## 2022-07-12 DIAGNOSIS — K432 Incisional hernia without obstruction or gangrene: Secondary | ICD-10-CM | POA: Insufficient documentation

## 2022-07-18 ENCOUNTER — Telehealth: Payer: Self-pay | Admitting: Cardiology

## 2022-07-18 NOTE — Telephone Encounter (Signed)
   Pre-operative Risk Assessment    Patient Name: Kristina Mcguire  DOB: Oct 29, 1940 MRN: [id number]      Request for Surgical Clearance    Procedure:   Open incisional and left inguinal hernias  Date of Surgery:  Clearance 08/12/22                                 Surgeon:  Samuel Germany, MD Surgeon's Group or Practice Name:  [facility]  Phone number:  [phone] Fax number:  [phone]   Type of Clearance Requested:   - Medical    Type of Anesthesia:  Not Indicated   Additional requests/questions:      Gaylyn Lambert   07/18/2022, 3:13 PM

## 2022-07-19 NOTE — Telephone Encounter (Signed)
This pt has upcoming appt with Dr. Agustin Cree in office on 3/6.  The request can be addressed at this time.

## 2022-07-19 NOTE — Telephone Encounter (Signed)
   Name: Kristina Mcguire  DOB: Nov 14, 1940  MRN: [id number]  Primary Cardiologist: Jenne Campus, MD   Preoperative team, please contact this patient and set up a phone call appointment for further preoperative risk assessment. Please obtain consent and complete medication review. Thank you for your help.  I confirm that guidance regarding antiplatelet and oral anticoagulation therapy has been completed and, if necessary, noted below.  May hold ASA if needed.   [name], PA 07/19/2022, 10:50 AM [place]

## 2022-07-28 IMAGING — DX DG PELVIS 1-2V
1 series · 1 of 1 positions shown · non-contrast
Comparison: None.

CLINICAL DATA: Left leg pain

EXAM:
PELVIS - 1 VIEW; LEFT FEMUR 2 VIEWS; LEFT TIBIA AND FIBULA - 2 VIEW

[pelvis ap]
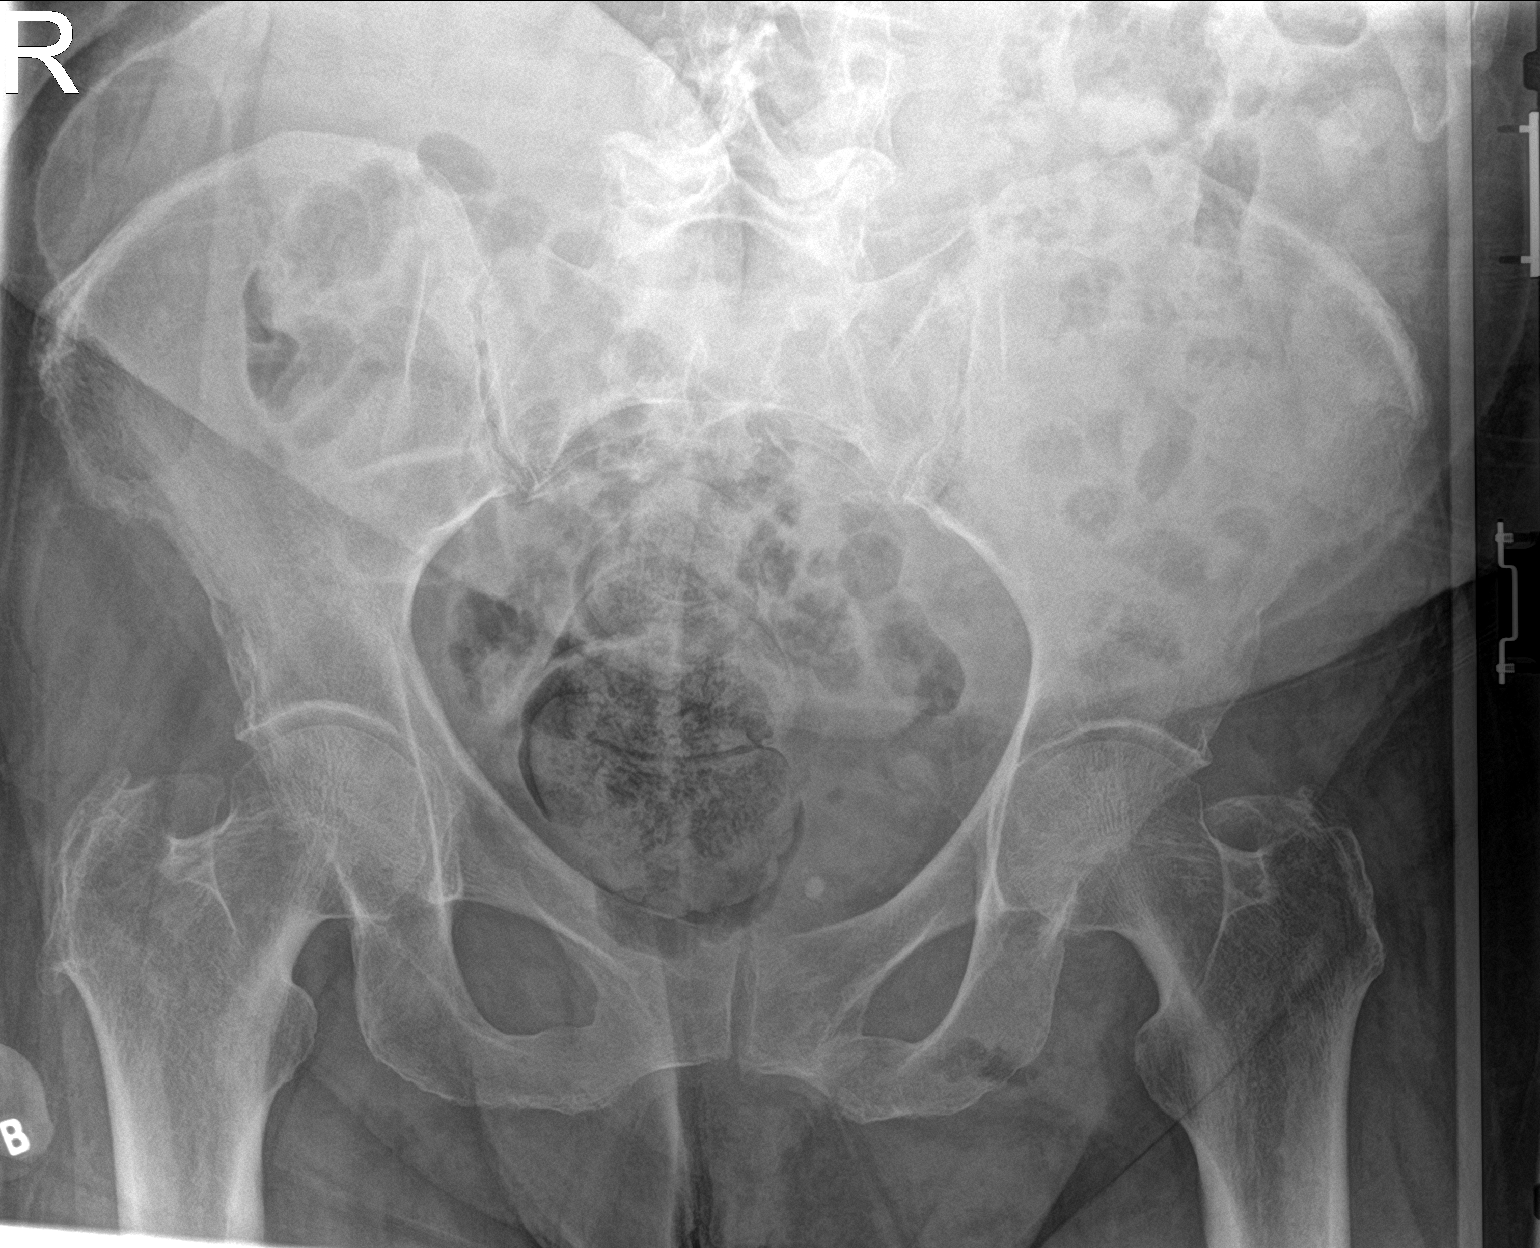

[1 of 1 positions shown; findings below may reference images not displayed]

FINDINGS: There is no evidence of pelvic fracture or diastasis. No pelvic bone
lesions are seen. Soft tissues are unremarkable.

No acute fracture of the left femur or left tibia and fibula. Soft
tissues are unremarkable. Moderate to severe tricompartmental
degenerative changes of the left knee, most pronounced at the medial
compartment.
IMPRESSION: 1. No acute osseous abnormality.
2. Moderate to severe degenerative changes of the left knee.

## 2022-08-07 ENCOUNTER — Ambulatory Visit: Payer: 59 | Attending: Cardiology | Admitting: Cardiology

## 2022-08-07 ENCOUNTER — Encounter: Payer: Self-pay | Admitting: Cardiology

## 2022-08-07 VITALS — BP 124/80 | HR 73 | Ht 65.5 in | Wt 251.0 lb

## 2022-08-07 DIAGNOSIS — I480 Paroxysmal atrial fibrillation: Secondary | ICD-10-CM

## 2022-08-07 DIAGNOSIS — I10 Essential (primary) hypertension: Secondary | ICD-10-CM

## 2022-08-07 DIAGNOSIS — J449 Chronic obstructive pulmonary disease, unspecified: Secondary | ICD-10-CM | POA: Diagnosis not present

## 2022-08-07 DIAGNOSIS — I2510 Atherosclerotic heart disease of native coronary artery without angina pectoris: Secondary | ICD-10-CM | POA: Diagnosis not present

## 2022-08-07 DIAGNOSIS — Z01810 Encounter for preprocedural cardiovascular examination: Secondary | ICD-10-CM

## 2022-08-07 NOTE — Patient Instructions (Signed)
Medication Instructions:  Your physician recommends that you continue on your current medications as directed. Please refer to the Current Medication list given to you today.  *If you need a refill on your cardiac medications before your next appointment, please call your pharmacy*   Lab Work: None Ordered If you have labs (blood work) drawn today and your tests are completely normal, you will receive your results only by: [place] (if you have MyChart) OR A paper copy in the mail If you have any lab test that is abnormal or we need to change your treatment, we will call you to review the results.   Testing/Procedures: Your physician has requested that you have a lexiscan myoview. For further information please visit [email or web address]. Please follow instruction sheet, as given.  The test will take approximately 3 to 4 hours to complete; you may bring reading material.  If someone comes with you to your appointment, they will need to remain in the main lobby due to limited space in the testing area.   How to prepare for your Myocardial Perfusion Test: Do not eat or drink 3 hours prior to your test, except you may have water. Do not consume products containing caffeine (regular or decaffeinated) 12 hours prior to your test. (ex: coffee, chocolate, sodas, tea). Do bring a list of your current medications with you.  If not listed below, you may take your medications as normal. Do wear comfortable clothes (no dresses or overalls) and walking shoes, tennis shoes preferred (No heels or open toe shoes are allowed). Do NOT wear cologne, perfume, aftershave, or lotions (deodorant is allowed). If these instructions are not followed, your test will have to be rescheduled.     Your physician has requested that you have an echocardiogram. Echocardiography is a painless test that uses sound waves to create images of your heart. It provides your doctor with information about the size and shape of  your heart and how well your heart's chambers and valves are working. This procedure takes approximately one hour. There are no restrictions for this procedure. Please do NOT wear cologne, perfume, aftershave, or lotions (deodorant is allowed). Please arrive 15 minutes prior to your appointment time.    Follow-Up: At [facility], you and your health needs are our priority.  As part of our continuing mission to provide you with exceptional heart care, we have created designated Provider Care Teams.  These Care Teams include your primary Cardiologist (physician) and Advanced Practice Providers (APPs -  Physician Assistants and Nurse Practitioners) who all work together to provide you with the care you need, when you need it.  We recommend signing up for the patient portal called "MyChart".  Sign up information is provided on this After Visit Summary.  MyChart is used to connect with patients for Virtual Visits (Telemedicine).  Patients are able to view lab/test results, encounter notes, upcoming appointments, etc.  Non-urgent messages can be sent to your provider as well.   To learn more about what you can do with MyChart, go to [email or web address].    Your next appointment:   6 month(s)  The format for your next appointment:   In Person  Provider:   Jenne Campus, MD    Other Instructions NA

## 2022-08-07 NOTE — Progress Notes (Signed)
Cardiology Office Note:    Date:  08/07/2022   ID:  Kristina Mcguire, DOB 03-Mar-1941, MRN [id number]  PCP:  Francesca Oman, DO  Cardiologist:  Jenne Campus, MD    Referring MD: Francesca Oman, DO   No chief complaint on file. I need hernia surgery  History of Present Illness:    Kristina Mcguire is a 82 y.o. female  with past medical history significant for paroxysmal atrial fibrillation, CHADS2 vascular score is 5, she is not anticoagulated secondary to history of falls and subdural hematoma but she does have Watchman device, also essential hypertension, remote coronary artery disease, dyslipidemia.  Comes today to my office is to be evaluated before elective hernia surgery.  Overall she says she lives very sedentary lifestyle.  Any exercise any effort will give her shortness of breath.  She denies have any chest pain tightness squeezing pressure burning chest.  Past Medical History:  Diagnosis Date   Acute on chronic diastolic congestive heart failure ([place]) 12/27/2016   Benign essential hypertension 01/05/2016   Last Assessment & Plan:  At goal controlled continue same medications DASH diet reinforced   Chronic intracranial subdural hematoma ([place]) 12/26/2016   Last Assessment & Plan:  Patient for TCM. Admission note, discharge summary and instructions have been reviewed.  Patient admitted for headache, and found to have a left subacute subdural hematoma. Patient status post  Surgical intervention, prior to reversal of pradaxa. Patient doing very good, no complications from procedure. Patient will have follow up appointment with neurosurgery on 8/14 for    Chronic kidney disease, stage III (moderate) ([place]) 05/07/2016   Last Assessment & Plan:  Stable,  monitoring,   Chronic midline low back pain without sciatica 05/07/2016   Last Assessment & Plan:  Formatting of this note might be different from the original. Stable, lorcet prescription renewed, last prescription issued 10/2016 for 90  tablets   Class 3 severe obesity due to excess calories with serious comorbidity and body mass index (BMI) of 40.0 to 44.9 in adult [facility]) 08/14/2017   Last Assessment & Plan:  Advised to start lifestyle changes: increasing physical activity at least 30 minutes 5 times a week, or as tolerated. Discussed low fat, low carb, small portion diet. Last Assessment & Plan:  Advised to start lifestyle changes: increasing physical activity at least 30 minutes 5 times a week, or as tolerated. Discussed low fat, low carb, small portion diet.  Formatting of t   COPD (chronic obstructive pulmonary disease) ([place]) 01/05/2016   Last Assessment & Plan:  Never smoked in the past nor present, but she has been diagnosed with COPD. Restarted home dose PRN Albuterol. Last Assessment & Plan:  Never smoked in the past nor present, but she has been diagnosed with COPD. Restarted home dose PRN Albuterol.   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 Overview:  Myocardial infarction in 70s   GERD (gastroesophageal reflux disease) 01/05/2016   Last Assessment & Plan:  Restarted on home dose of PPI Last Assessment & Plan:  Restarted on home dose of PPI   Heart failure with preserved ejection fraction ([place]) 12/27/2016   History of burr hole surgery 02/04/2017   Last Assessment & Plan:  Vital signs are stable.  This is a very pleasant 82 y.o.  who is status post left frontal parietal occipital bur holes for evacuation of chronic subdural hematoma by Dr. Guinevere Ferrari on  12/27/2016.  Patient states symptoms experienced prior to surgery have improved.  Patient denies vision changes, tinnitus, dysphagia, dysarthria, seizure activity, or loss of consciousness since    History of subdural hematoma 12/25/2016   Formatting of this note might be different from the original. Overview:  Last Assessment & Plan:  Patient has been doing well since her discharge from the hospital  and since her recent surgery on 12/27/2016.  She denies all neurologic complaints.  She states that the right-sided weakness that she was experiencing prior to surgery has completely resolved.  She denies any headaches at this time.  [name]   Microcytic anemia 01/05/2016   Mixed hyperlipidemia 12/08/2018   Osteopenia of multiple sites 05/07/2016   Last Assessment & Plan:  Formatting of this note might be different from the original. Severe as per last BMD   Paroxysmal atrial fibrillation ([place]) 01/05/2016   Last Assessment & Plan:  Patient is rate controlled, sinus rhythm. On beta blocker, off anticoagulation and antiplatelets since contraindicated at this time due to recent subdural hematoma. Last Assessment & Plan:  Patient is rate controlled, sinus rhythm. On beta blocker, off anticoagulation and antiplatelets since contraindicated at this time due to recent subdural hematoma.  Formatting of this    Presence of Watchman left atrial appendage closure device 03/17/2018   Done in [facility] by Dr. Denman George in summer 2019   Red blood cell antibody positive 03/05/2018   Formatting of this note might be different from the original. Anti-K Formatting of this note might be different from the original. 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  Otherwise, previous echocardiogram showed normal left ventricular systolic function.  As such, a cardiac etiology is less likely.  Recommendation:  No cardiac intervention is needed at the moment.  She should continue the COPD treatment and follow up with   Subdural hemorrhage ([place]) 12/25/2016   Last Assessment & Plan:  Patient has been doing well since her discharge from the hospital and since her recent surgery on 12/27/2016.  She denies all neurologic complaints.  She states that the right-sided weakness  that she was experiencing prior to surgery has completely resolved.  She denies any headaches at this time.  She denies any issues with loss of consciousness or seizure-like activity.    Uncomplicated opioid dependence ([place]) 07/24/2016   Last Assessment & Plan:  Referring to [facility] program On hydrocodone daily PRN, Last Prescription filled 07/24/16. No drug seeking behavior, sign of abuse or misuse    Past Surgical History:  Procedure Laterality Date   ABDOMINAL HYSTERECTOMY     BURR HOLE FOR SUBDURAL HEMATOMA     EYE SURGERY     TUBAL LIGATION      Current Medications: Current Meds  Medication Sig   albuterol (PROVENTIL HFA;VENTOLIN HFA) 108 (90 Base) MCG/ACT inhaler Inhale 2 puffs into the lungs every 6 (six) hours as needed for wheezing or shortness of breath.   aspirin EC 81 MG tablet Take 1 tablet (81 mg total) by mouth daily.   atorvastatin (LIPITOR) 20 MG tablet Take 20 mg by mouth daily.   carvedilol (COREG) 12.5 MG tablet Take 1 tablet by mouth 2 (two) times daily.   cetirizine (ZYRTEC) 10 MG tablet Take 1 tablet by mouth daily.   Cholecalciferol 25 MCG (1000  UT) tablet Take 2,000 Units by mouth 2 (two) times daily.   diclofenac Sodium (VOLTAREN) 1 % GEL Apply 2 g topically every 6 (six) hours as needed (arthritis).   digoxin (LANOXIN) 0.125 MG tablet Take 1 tablet (125 mcg total) by mouth every other day.   docusate sodium (COLACE) 100 MG capsule Take 1 capsule by mouth as needed for mild constipation or moderate constipation.   DULoxetine (CYMBALTA) 30 MG capsule Take 30 mg by mouth daily.   ferrous sulfate 325 (65 FE) MG tablet Take 1 tablet by mouth daily.   fluticasone (FLONASE) 50 MCG/ACT nasal spray Place 1 spray into the nose as needed for allergies.   furosemide (LASIX) 20 MG tablet Take 1 tablet by mouth 2 (two) times daily.   lisinopril (PRINIVIL,ZESTRIL) 20 MG tablet Take 1 tablet by mouth daily.   Multiple Vitamins-Minerals (MULTIVITAMIN ADULT PO) Take 1 tablet by  mouth daily. Unknown strength   pantoprazole (PROTONIX) 20 MG tablet Take 1 tablet by mouth daily.   polyvinyl alcohol (LIQUIFILM TEARS) 1.4 % ophthalmic solution Place 2 drops into both eyes as needed for dry eyes.   potassium chloride (K-DUR) 10 MEQ tablet Take 1 tablet by mouth daily.     Allergies:   Penicillins, Bee pollen, Nsaids, and Pollen extract   Social History   Socioeconomic History   Marital status: Single    Spouse name: Not on file   Number of children: Not on file   Years of education: Not on file   Highest education level: Not on file  Occupational History   Not on file  Tobacco Use   Smoking status: Never    Passive exposure: Never   Smokeless tobacco: Never  Vaping Use   Vaping Use: Never used  Substance and Sexual Activity   Alcohol use: Never   Drug use: Never   Sexual activity: Not on file  Other Topics Concern   Not on file  Social History Narrative   Not on file   Social Determinants of Health   Financial Resource Strain: Not on file  Food Insecurity: Not on file  Transportation Needs: Not on file  Physical Activity: Not on file  Stress: Not on file  Social Connections: Not on file     Family History: The patient's family history includes Heart disease in her father, mother, and sister; Hypertension in her mother and sister; Stroke in her mother. ROS:   Please see the history of present illness.    All 14 point review of systems negative except as described per history of present illness  EKGs/Labs/Other Studies Reviewed:      Recent Labs: No results found for requested labs within last 365 days.  Recent Lipid Panel No results found for: "CHOL", "TRIG", "HDL", "CHOLHDL", "VLDL", "LDLCALC", "LDLDIRECT"  Physical Exam:    VS:  BP 124/80 (BP Location: Left Arm, Patient Position: Sitting, Cuff Size: Large)   Pulse 73   Ht 5' 5.5" (1.664 m)   Wt 251 lb (113.9 kg)   SpO2 97%   BMI 41.13 kg/m     Wt Readings from Last 3 Encounters:   08/07/22 251 lb (113.9 kg)  01/18/22 253 lb 1.9 oz (114.8 kg)  07/24/21 247 lb 1.9 oz (112.1 kg)     GEN:  Well nourished, well developed in no acute distress HEENT: Normal NECK: No JVD; No carotid bruits LYMPHATICS: No lymphadenopathy CARDIAC: RRR, no murmurs, no rubs, no gallops RESPIRATORY:  Clear to auscultation without rales, wheezing  or rhonchi  ABDOMEN: Soft, non-tender, non-distended MUSCULOSKELETAL:  No edema; No deformity  SKIN: Warm and dry LOWER EXTREMITIES: no swelling NEUROLOGIC:  Alert and oriented x 3 PSYCHIATRIC:  Normal affect   ASSESSMENT:    1. Coronary artery disease involving native coronary artery of native heart without angina pectoris   2. Paroxysmal atrial fibrillation (HCC)   3. Benign essential hypertension   4. Chronic obstructive pulmonary disease, unspecified COPD type ([place])   5. Preop cardiovascular exam    PLAN:    In order of problems listed above:  Cardiovascular preop evaluation for this lady with history of coronary artery disease, I will ask her to have echocardiogram scheduled to assess left ventricle ejection fraction this is mostly because of [employer] exertion I want a make sure were not dealing with any cardiomyopathy, she will be also scheduled to have Lexiscan to rule out any potential ischemia. Paroxysmal atrial fibrillation not anticoagulated she does have Watchman device denies have any palpitations today normal rhythm. Benign essential hypertension blood pressure seems to well-controlled we will continue present management. COPD.  Presently not   Medication Adjustments/Labs and Tests Ordered: Current medicines are reviewed at length with the patient today.  Concerns regarding medicines are outlined above.  No orders of the defined types were placed in this encounter.  Medication changes: No orders of the defined types were placed in this encounter.   Signed, Park Liter, MD, [facility] 08/07/2022 12:21 PM    [place]

## 2022-08-07 NOTE — Telephone Encounter (Signed)
Received a fax from [place] stating patient's surgery has been moved to 09/04/22.

## 2022-08-13 ENCOUNTER — Telehealth (HOSPITAL_COMMUNITY): Payer: Self-pay | Admitting: *Deleted

## 2022-08-13 NOTE — Telephone Encounter (Signed)
Pt reached and given instructions for MPI study scheduled on 08/15/22 and 08/16/22.

## 2022-08-15 ENCOUNTER — Ambulatory Visit (HOSPITAL_COMMUNITY): Payer: 59 | Attending: Cardiovascular Disease

## 2022-08-15 DIAGNOSIS — I2510 Atherosclerotic heart disease of native coronary artery without angina pectoris: Secondary | ICD-10-CM | POA: Diagnosis present

## 2022-08-15 DIAGNOSIS — I480 Paroxysmal atrial fibrillation: Secondary | ICD-10-CM | POA: Diagnosis present

## 2022-08-15 DIAGNOSIS — Z01810 Encounter for preprocedural cardiovascular examination: Secondary | ICD-10-CM | POA: Diagnosis present

## 2022-08-15 MED ORDER — REGADENOSON 0.4 MG/5ML IV SOLN
0.4000 mg | Freq: Once | INTRAVENOUS | Status: AC
  Administered 2022-08-15: 0.4 mg via INTRAVENOUS

## 2022-08-15 MED ORDER — TECHNETIUM TC 99M TETROFOSMIN IV KIT
31.8000 | PACK | Freq: Once | INTRAVENOUS | Status: AC | PRN
  Administered 2022-08-15: 31.8 via INTRAVENOUS

## 2022-08-16 ENCOUNTER — Ambulatory Visit (HOSPITAL_COMMUNITY): Payer: 59 | Attending: Cardiology

## 2022-08-16 LAB — MYOCARDIAL PERFUSION IMAGING
LV dias vol: 82 mL (ref 46–106)
LV sys vol: 35 mL
Nuc Stress EF: 57 %
Peak HR: 82 {beats}/min
Rest HR: 65 {beats}/min
SDS: 1
SRS: 0
SSS: 1
ST Depression (mm): 0 mm
Stress Nuclear Isotope Dose: 31.8 mCi
TID: 0.99

## 2022-08-16 MED ORDER — TECHNETIUM TC 99M TETROFOSMIN IV KIT
31.3000 | PACK | Freq: Once | INTRAVENOUS | Status: AC | PRN
  Administered 2022-08-16: 31.3 via INTRAVENOUS

## 2022-08-19 ENCOUNTER — Telehealth: Payer: Self-pay | Admitting: Cardiology

## 2022-08-19 NOTE — Telephone Encounter (Signed)
Follow Up:     Suanne Marker from [facility] office called. She would like to know the status of patient's clearance. Please fax  to [phone].

## 2022-08-19 NOTE — Telephone Encounter (Signed)
Pt was seen by Dr. Agustin Cree 08/07/22; however looks like MD has ordered stress test and echo before the pt can be cleared. I will update the requesting office. Once the pt has been cleared we will fax notes and recommendations.

## 2022-08-21 ENCOUNTER — Ambulatory Visit (HOSPITAL_BASED_OUTPATIENT_CLINIC_OR_DEPARTMENT_OTHER)
Admission: RE | Admit: 2022-08-21 | Discharge: 2022-08-21 | Disposition: A | Discharge status: Home / Self Care | Payer: 59 | Source: Ambulatory Visit | Attending: Cardiology | Admitting: Cardiology

## 2022-08-21 DIAGNOSIS — Z01810 Encounter for preprocedural cardiovascular examination: Secondary | ICD-10-CM | POA: Diagnosis not present

## 2022-08-21 DIAGNOSIS — I480 Paroxysmal atrial fibrillation: Secondary | ICD-10-CM | POA: Diagnosis not present

## 2022-08-21 DIAGNOSIS — I2510 Atherosclerotic heart disease of native coronary artery without angina pectoris: Secondary | ICD-10-CM

## 2022-08-21 LAB — ECHOCARDIOGRAM COMPLETE
Area-P 1/2: 2.96 cm2
MV M vel: 2.81 m/s
MV Peak grad: 31.6 mmHg
S' Lateral: 2.9 cm

## 2022-08-23 ENCOUNTER — Other Ambulatory Visit: Payer: Self-pay

## 2022-08-23 ENCOUNTER — Encounter (HOSPITAL_BASED_OUTPATIENT_CLINIC_OR_DEPARTMENT_OTHER): Payer: Self-pay | Admitting: Emergency Medicine

## 2022-08-23 ENCOUNTER — Emergency Department (HOSPITAL_BASED_OUTPATIENT_CLINIC_OR_DEPARTMENT_OTHER)
Admission: EM | Admit: 2022-08-23 | Discharge: 2022-08-23 | Disposition: A | Discharge status: Home / Self Care | Payer: 59 | Attending: Emergency Medicine | Admitting: Emergency Medicine

## 2022-08-23 DIAGNOSIS — I130 Hypertensive heart and chronic kidney disease with heart failure and stage 1 through stage 4 chronic kidney disease, or unspecified chronic kidney disease: Secondary | ICD-10-CM | POA: Insufficient documentation

## 2022-08-23 DIAGNOSIS — L0291 Cutaneous abscess, unspecified: Secondary | ICD-10-CM

## 2022-08-23 DIAGNOSIS — Z7951 Long term (current) use of inhaled steroids: Secondary | ICD-10-CM | POA: Diagnosis not present

## 2022-08-23 DIAGNOSIS — J449 Chronic obstructive pulmonary disease, unspecified: Secondary | ICD-10-CM | POA: Insufficient documentation

## 2022-08-23 DIAGNOSIS — Z7982 Long term (current) use of aspirin: Secondary | ICD-10-CM | POA: Insufficient documentation

## 2022-08-23 DIAGNOSIS — N183 Chronic kidney disease, stage 3 unspecified: Secondary | ICD-10-CM | POA: Diagnosis not present

## 2022-08-23 DIAGNOSIS — L02213 Cutaneous abscess of chest wall: Secondary | ICD-10-CM | POA: Insufficient documentation

## 2022-08-23 DIAGNOSIS — I2510 Atherosclerotic heart disease of native coronary artery without angina pectoris: Secondary | ICD-10-CM | POA: Diagnosis not present

## 2022-08-23 DIAGNOSIS — Z79899 Other long term (current) drug therapy: Secondary | ICD-10-CM | POA: Diagnosis not present

## 2022-08-23 DIAGNOSIS — I5033 Acute on chronic diastolic (congestive) heart failure: Secondary | ICD-10-CM | POA: Insufficient documentation

## 2022-08-23 MED ORDER — DOXYCYCLINE HYCLATE 100 MG PO CAPS: 100.0000 mg | ORAL_CAPSULE | Freq: Two times a day (BID) | ORAL | 0 refills | Status: DC

## 2022-08-23 MED ORDER — LIDOCAINE-EPINEPHRINE (PF) 2 %-1:200000 IJ SOLN
10.0000 mL | Freq: Once | INTRAMUSCULAR | Status: AC
  Administered 2022-08-23: 10 mL
  Filled 2022-08-23: qty 20

## 2022-08-23 NOTE — Discharge Instructions (Addendum)
Remove the packing in around 2 days.  Follow-up with her doctor.

## 2022-08-23 NOTE — ED Provider Notes (Signed)
[facility] Provider Note   CSN: [id number] Arrival date & time: 08/23/22  1627     History  Chief Complaint  Patient presents with   Abscess    Kristina Mcguire is a 82 y.o. female.   Abscess Patient presents with abscess to left chest.  Has had for the last few days.  Reportedly has had some swelling there previously but now red and more painful.  No trauma.  No fevers or chills.  Feeling well otherwise.  Presents with family members.    Past Medical History:  Diagnosis Date   Acute on chronic diastolic congestive heart failure ([place]) 12/27/2016   Benign essential hypertension 01/05/2016   Last Assessment & Plan:  At goal controlled continue same medications DASH diet reinforced   Chronic intracranial subdural hematoma ([place]) 12/26/2016   Last Assessment & Plan:  Patient for TCM. Admission note, discharge summary and instructions have been reviewed.  Patient admitted for headache, and found to have a left subacute subdural hematoma. Patient status post  Surgical intervention, prior to reversal of pradaxa. Patient doing very good, no complications from procedure. Patient will have follow up appointment with neurosurgery on 8/14 for    Chronic kidney disease, stage III (moderate) ([place]) 05/07/2016   Last Assessment & Plan:  Stable,  monitoring,   Chronic midline low back pain without sciatica 05/07/2016   Last Assessment & Plan:  Formatting of this note might be different from the original. Stable, lorcet prescription renewed, last prescription issued 10/2016 for 90 tablets   Class 3 severe obesity due to excess calories with serious comorbidity and body mass index (BMI) of 40.0 to 44.9 in adult [facility]) 08/14/2017   Last Assessment & Plan:  Advised to start lifestyle changes: increasing physical activity at least 30 minutes 5 times a week, or as tolerated. Discussed low fat, low carb, small portion diet. Last Assessment & Plan:  Advised to start  lifestyle changes: increasing physical activity at least 30 minutes 5 times a week, or as tolerated. Discussed low fat, low carb, small portion diet.  Formatting of t   COPD (chronic obstructive pulmonary disease) ([place]) 01/05/2016   Last Assessment & Plan:  Never smoked in the past nor present, but she has been diagnosed with COPD. Restarted home dose PRN Albuterol. Last Assessment & Plan:  Never smoked in the past nor present, but she has been diagnosed with COPD. Restarted home dose PRN Albuterol.   Coronary artery disease 09/17/2017   Myocardial infarction in 36s  Formatting of this note might be different from the original. Overview:  Myocardial infarction in 70s   GERD (gastroesophageal reflux disease) 01/05/2016   Last Assessment & Plan:  Restarted on home dose of PPI Last Assessment & Plan:  Restarted on home dose of PPI   Heart failure with preserved ejection fraction ([place]) 12/27/2016   History of burr hole surgery 02/04/2017   Last Assessment & Plan:  Vital signs are stable.  This is a very pleasant 82 y.o.  who is status post left frontal parietal occipital bur holes for evacuation of chronic subdural hematoma by Dr. Guinevere Ferrari on 12/27/2016.  Patient states symptoms experienced prior to surgery have improved.  Patient denies vision changes, tinnitus, dysphagia, dysarthria, seizure activity, or loss of consciousness since    History of subdural hematoma 12/25/2016   Formatting of this note might be different from the original. Overview:  Last Assessment & Plan:  Patient has been doing well  since her discharge from the hospital and since her recent surgery on 12/27/2016.  She denies all neurologic complaints.  She states that the right-sided weakness that she was experiencing prior to surgery has completely resolved.  She denies any headaches at this time.  Sh   Microcytic anemia 01/05/2016   Mixed hyperlipidemia 12/08/2018   Osteopenia of multiple sites 05/07/2016   Last Assessment & Plan:  Formatting of this  note might be different from the original. Severe as per last BMD   Paroxysmal atrial fibrillation ([place]) 01/05/2016   Last Assessment & Plan:  Patient is rate controlled, sinus rhythm. On beta blocker, off anticoagulation and antiplatelets since contraindicated at this time due to recent subdural hematoma. Last Assessment & Plan:  Patient is rate controlled, sinus rhythm. On beta blocker, off anticoagulation and antiplatelets since contraindicated at this time due to recent subdural hematoma.  Formatting of this    Presence of Watchman left atrial appendage closure device 03/17/2018   Done in [facility] by Dr. Denman George in summer 2019   Red blood cell antibody positive 03/05/2018   Formatting of this note might be different from the original. Anti-K Formatting of this note might be different from the original. 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  Otherwise, previous echocardiogram showed normal left ventricular systolic function.  As such, a cardiac etiology is less likely.  Recommendation:  No cardiac intervention is needed at the moment.  She should continue the COPD treatment and follow up with   Subdural hemorrhage ([place]) 12/25/2016   Last Assessment & Plan:  Patient has been doing well since her discharge from the hospital and since her recent surgery on 12/27/2016.  She denies all neurologic complaints.  She states that the right-sided weakness that she was experiencing prior to surgery has completely resolved.  She denies any headaches at this time.  She denies any issues with loss of consciousness or seizure-like activity.    Uncomplicated opioid dependence ([place]) 07/24/2016   Last Assessment & Plan:  Referring to [facility] program On hydrocodone daily PRN, Last Prescription filled 07/24/16. No drug seeking behavior, sign of  abuse or misuse    Home Medications Prior to Admission medications   Medication Sig Start Date End Date Taking? Authorizing Provider  doxycycline (VIBRAMYCIN) 100 MG capsule Take 1 capsule (100 mg total) by mouth 2 (two) times daily. 08/23/22  Yes Davonna Belling, MD  albuterol (PROVENTIL HFA;VENTOLIN HFA) 108 (90 Base) MCG/ACT inhaler Inhale 2 puffs into the lungs every 6 (six) hours as needed for wheezing or shortness of breath.    [provider]  aspirin EC 81 MG tablet Take 1 tablet (81 mg total) by mouth daily. 11/13/18   Park Liter, MD  atorvastatin (LIPITOR) 20 MG tablet Take 20 mg by mouth daily. 02/12/17   [provider]  carvedilol (COREG) 12.5 MG tablet Take 1 tablet by mouth 2 (two) times daily. 02/07/17   [provider]  cetirizine (ZYRTEC) 10 MG tablet Take 1 tablet by mouth daily. 11/07/16   [provider]  Cholecalciferol 25 MCG (1000 UT) tablet Take 2,000 Units by mouth 2 (two) times daily.    [provider]  diclofenac Sodium (VOLTAREN) 1 % GEL Apply 2 g topically every 6 (six) hours as needed (arthritis). 09/28/19  [provider]  digoxin (LANOXIN) 0.125 MG tablet Take 1 tablet (125 mcg total) by mouth every other day. 07/24/18   Park Liter, MD  docusate sodium (COLACE) 100 MG capsule Take 1 capsule by mouth as needed for mild constipation or moderate constipation. 12/31/16   [provider]  DULoxetine (CYMBALTA) 30 MG capsule Take 30 mg by mouth daily. 12/19/19   [provider]  ferrous sulfate 325 (65 FE) MG tablet Take 1 tablet by mouth daily.    [provider]  fluticasone (FLONASE) 50 MCG/ACT nasal spray Place 1 spray into the nose as needed for allergies. 03/19/16   [provider]  furosemide (LASIX) 20 MG tablet Take 1 tablet by mouth 2 (two) times daily. 11/07/16   [provider]  lisinopril (PRINIVIL,ZESTRIL) 20 MG tablet Take 1 tablet by mouth daily.  02/07/17   [provider]  Multiple Vitamins-Minerals (MULTIVITAMIN ADULT PO) Take 1 tablet by mouth daily. Unknown strength    [provider]  pantoprazole (PROTONIX) 20 MG tablet Take 1 tablet by mouth daily. 02/12/17   [provider]  polyvinyl alcohol (LIQUIFILM TEARS) 1.4 % ophthalmic solution Place 2 drops into both eyes as needed for dry eyes.    [provider]  potassium chloride (K-DUR) 10 MEQ tablet Take 1 tablet by mouth daily. 03/25/17   [provider]      Allergies    Penicillins, Bee pollen, Nsaids, and Pollen extract    Review of Systems   Review of Systems  Physical Exam Updated Vital Signs BP (!) 144/72 (BP Location: Left Arm)   Pulse 72   Temp 98.9 F (37.2 C) (Oral)   Resp 18   Ht 5' 5.5" (1.664 m)   Wt 113.9 kg   SpO2 99%   BMI 41.13 kg/m  Physical Exam Vitals reviewed.  Skin:    Comments: Anterior chest just to the left of the sternum there is an approximate 3 to 4 cm raised area with central erythema.  Some tenderness.  Firm but may have some mild underlying fluctuance.  May have small central sebaceous cyst.  Neurological:     Mental Status: She is alert. Mental status is at baseline.     ED Results / Procedures / Treatments   Labs (all labs ordered are listed, but only abnormal results are displayed) Labs Reviewed  AEROBIC CULTURE W GRAM STAIN (SUPERFICIAL SPECIMEN)    EKG None  Radiology No results found.  Procedures .Marland KitchenIncision and Drainage  Date/Time: 08/23/2022 8:16 PM  Performed by: Davonna Belling, MD Authorized by: Davonna Belling, MD   Consent:    Consent obtained:  Verbal   Consent given by:  Patient   Risks, benefits, and alternatives were discussed: yes     Risks discussed:  Bleeding, damage to other organs and incomplete drainage   Alternatives discussed:  No treatment, delayed treatment and alternative treatment Location:    Type:  Abscess   Size:  3   Location:   Trunk   Trunk location:  Chest Pre-procedure details:    Skin preparation:  Chlorhexidine with alcohol Sedation:    Sedation type:  None Anesthesia:    Anesthesia method:  Local infiltration   Local anesthetic:  Lidocaine 2% WITH epi Procedure type:    Complexity:  Complex Procedure details:    Ultrasound guidance: yes     Incision types:  Single straight   Incision depth:  Subcutaneous   Wound management:  Probed and deloculated  Drainage:  Purulent   Drainage amount:  Moderate   Packing materials:  1/4 in iodoform gauze   Amount 1/4" iodoform:  2 inches Post-procedure details:    Procedure completion:  Tolerated well, no immediate complications     Medications Ordered in ED Medications  lidocaine-EPINEPHrine (XYLOCAINE W/EPI) 2 %-1:200000 (PF) injection 10 mL (10 mLs Infiltration Given by Other 08/23/22 1949)    ED Course/ Medical Decision Making/ A&P                             Medical Decision Making Risk Prescription drug management.   Patient with likely abscess on left chest.  Bedside ultrasound done and did show some fluid.  Localized to the area.  Incision and drainage done with purulent drainage along with some fatty drainage.  Likely infected sebaceous cyst.  Had had some swelling there previously.  Small amount of packing put in.  Will give with antibiotics.  Remove packing in 2 days.  Culture also sent.        Final Clinical Impression(s) / ED Diagnoses Final diagnoses:  Abscess    Rx / DC Orders ED Discharge Orders          Ordered    doxycycline (VIBRAMYCIN) 100 MG capsule  2 times daily        08/23/22 2016              Davonna Belling, MD 08/23/22 2017

## 2022-08-23 NOTE — ED Triage Notes (Signed)
Pt via POV c/o abscess to left mid chest, redness and swelling noted to the area. She reports some nausea and mild SOB, hx CHF, A Fib, CKD stage 3, brain bleed. No blood thinners. Pt is allergic to penicillin.

## 2022-08-26 ENCOUNTER — Telehealth: Payer: Self-pay | Admitting: Cardiology

## 2022-08-26 NOTE — Telephone Encounter (Signed)
Left message for the surgery scheduler at [facility] about update clearance testing and results. I have faxed over office note to requesting provider's office. I let her know if she has any questions to feel free to call us back if she needs more information.

## 2022-08-26 NOTE — Telephone Encounter (Signed)
  [facility]  calling to f/u clearance. If pt been cleared after her echo. Procedure is next week

## 2022-08-27 ENCOUNTER — Telehealth: Payer: Self-pay

## 2022-08-27 NOTE — Telephone Encounter (Signed)
Left My Chart message with normal results per Dr. Wendy Poet note. Routed to PCP.

## 2022-08-30 ENCOUNTER — Telehealth: Payer: Self-pay | Admitting: Cardiology

## 2022-08-30 LAB — AEROBIC CULTURE W GRAM STAIN (SUPERFICIAL SPECIMEN): Special Requests: NORMAL

## 2022-08-30 NOTE — Telephone Encounter (Signed)
Faxed EKG as requested.

## 2022-08-30 NOTE — Telephone Encounter (Signed)
Office calling to request that pt's most recent EKG and results from 3/6 be faxed to their office. Due to pt's upcoming procedure with them on 4/3. Please advise

## 2022-08-31 ENCOUNTER — Telehealth (HOSPITAL_BASED_OUTPATIENT_CLINIC_OR_DEPARTMENT_OTHER): Payer: Self-pay | Admitting: *Deleted

## 2022-08-31 NOTE — Telephone Encounter (Signed)
Post ED Visit - Positive Culture Follow-up  Culture report reviewed by antimicrobial stewardship pharmacist: [place] Team []  Elenor Quinones, Pharm.D. []  Heide Guile, Pharm.D., BCPS AQ-ID []  Parks Neptune, Pharm.D., BCPS []  Alycia Rossetti, Pharm.D., BCPS []  [place], Pharm.D., BCPS, AAHIVP []  Legrand Como, Pharm.D., BCPS, AAHIVP []  Salome Arnt, PharmD, BCPS []  Johnnette Gourd, PharmD, BCPS []  Hughes Better, PharmD, BCPS []  Leeroy Cha, PharmD []  Laqueta Linden, PharmD, BCPS []  Albertina Parr, PharmD  [place] Team []  Leodis Sias, PharmD []  Lindell Spar, PharmD []  Royetta Asal, PharmD []  Graylin Shiver, Rph []  Rema Fendt) Glennon Mac, PharmD []  Arlyn Dunning, PharmD []  Netta Cedars, PharmD []  Dia Sitter, PharmD []  Leone Haven, PharmD []  Gretta Arab, PharmD []  Theodis Shove, PharmD []  Peggyann Juba, PharmD []  Reuel Boom, PharmD   Positive wound culture Treated with Coxycycline Hyclate, organism sensitive to the same and no further patient follow-up is required at this time.  Reviewed by Pharmacy  Ardeen Fillers 08/31/2022, 12:19 PM

## 2022-09-05 DIAGNOSIS — Z7409 Other reduced mobility: Secondary | ICD-10-CM | POA: Insufficient documentation

## 2022-09-06 DIAGNOSIS — Z789 Other specified health status: Secondary | ICD-10-CM | POA: Insufficient documentation

## 2023-03-19 DIAGNOSIS — J984 Other disorders of lung: Secondary | ICD-10-CM | POA: Insufficient documentation

## 2023-04-06 ENCOUNTER — Encounter: Payer: Self-pay | Admitting: Cardiology

## 2023-04-29 ENCOUNTER — Telehealth: Payer: Self-pay | Admitting: Cardiology

## 2023-04-29 NOTE — Telephone Encounter (Signed)
Patient c/o Palpitations:  STAT if patient reporting lightheadedness, shortness of breath, or chest pain  How long have you had palpitations/irregular HR/ Afib? Are you having the symptoms now? Started today about 2 hours ago, is having palpitations now   Are you currently experiencing lightheadedness, SOB or CP? No   Do you have a history of afib (atrial fibrillation) or irregular heart rhythm? Yes   Have you checked your BP or HR? (document readings if available): 133/89 HR 95  Are you experiencing any other symptoms? No    HR ranging 95-135 over the past two hours.

## 2023-04-29 NOTE — Telephone Encounter (Signed)
Spoke with Dr. Bing Matter regarding pts message. He recommended that she come in for an EKG tomorrow. Pt agreed. Information sent to front desk to make appt for nurse visit.

## 2023-04-30 ENCOUNTER — Ambulatory Visit: Payer: 59 | Attending: Cardiology

## 2023-04-30 VITALS — BP 110/64 | HR 66 | Resp 18 | Ht 65.0 in | Wt 234.0 lb

## 2023-04-30 DIAGNOSIS — I480 Paroxysmal atrial fibrillation: Secondary | ICD-10-CM

## 2023-04-30 NOTE — Progress Notes (Signed)
   Nurse Visit   Date of Encounter: 04/30/2023 ID: Kristina Mcguire, DOB Sep 10, 1940, MRN [id number]  PCP:  Ruthell Rummage, PA-C   [facility] Providers Cardiologist:  Gypsy Balsam, MD      Visit Details   VS:  BP 110/64 (BP Location: Left Arm, Patient Position: Sitting, Cuff Size: Normal)   Pulse 66   Resp 18   Ht 5\' 5"  (1.651 m)   Wt 234 lb 0.4 oz (106.2 kg)   SpO2 94%   BMI 38.94 kg/m  , BMI Body mass index is 38.94 kg/m.  Wt Readings from Last 3 Encounters:  04/30/23 234 lb 0.4 oz (106.2 kg)  08/23/22 251 lb (113.9 kg)  08/15/22 251 lb (113.9 kg)     Reason for visit: EKG Performed today: Vitals, EKG, Provider consulted:Krasowski, and Education Changes (medications, testing, etc.) : none Length of Visit: 15 minutes    Medications Adjustments/Labs and Tests Ordered: Orders Placed This Encounter  Procedures   EKG 12-Lead   No orders of the defined types were placed in this encounter.    Signed, Eleonore Chiquito, RN  04/30/2023 1:08 PM

## 2023-05-08 ENCOUNTER — Encounter: Payer: Self-pay | Admitting: *Deleted

## 2023-05-08 ENCOUNTER — Ambulatory Visit: Payer: 59 | Attending: Cardiology

## 2023-05-08 ENCOUNTER — Encounter: Payer: Self-pay | Admitting: Cardiology

## 2023-05-08 ENCOUNTER — Ambulatory Visit: Payer: 59 | Attending: Cardiology | Admitting: Cardiology

## 2023-05-08 VITALS — BP 132/77 | HR 65 | Ht 65.0 in | Wt 234.0 lb

## 2023-05-08 DIAGNOSIS — I480 Paroxysmal atrial fibrillation: Secondary | ICD-10-CM

## 2023-05-08 DIAGNOSIS — J449 Chronic obstructive pulmonary disease, unspecified: Secondary | ICD-10-CM

## 2023-05-08 DIAGNOSIS — I6203 Nontraumatic chronic subdural hemorrhage: Secondary | ICD-10-CM | POA: Diagnosis not present

## 2023-05-08 DIAGNOSIS — I2510 Atherosclerotic heart disease of native coronary artery without angina pectoris: Secondary | ICD-10-CM

## 2023-05-08 DIAGNOSIS — Z95818 Presence of other cardiac implants and grafts: Secondary | ICD-10-CM

## 2023-05-08 NOTE — Addendum Note (Signed)
Addended by: Baldo Ash D on: 05/08/2023 02:01 PM   Modules accepted: Orders

## 2023-05-08 NOTE — Progress Notes (Signed)
Cardiology Office Note:    Date:  05/08/2023   ID:  Kristina Mcguire, DOB [birth year], MRN [id number]  PCP:  Ruthell Rummage, PA-C  Cardiologist:  Gypsy Balsam, MD    Referring MD: Ruthell Rummage, *   Chief Complaint  Patient presents with   Follow-up    History of Present Illness:    Kristina Mcguire is a 82 y.o. female past medical history significant for paroxysmal atrial fibrillation, CHADS2 Vascor equals 5 however she is not anticoagulated secondary to falls and subdural hematoma but she does have Watchman device, essential hypertension, remote coronary artery disease, dyslipidemia.  She comes to follow-up.  Few weeks ago she called [place] complaining of having palpitation will put EKG on she did have normal rhythm with some APCs.  She said since that time she had maybe 2 episodes of palpitation lasting for few minutes of and after that everything normalized.  Today sinus rhythm.  Denies have any chest pain tightness squeezing pressure burning chest, she admits that she lives very sedentary lifestyle  Past Medical History:  Diagnosis Date   Acute on chronic diastolic congestive heart failure (HCC) 12/27/2016   Benign essential hypertension 01/05/2016   Last Assessment & Plan:  At goal controlled continue same medications DASH diet reinforced   Chronic intracranial subdural hematoma (HCC) 12/26/2016   Last Assessment & Plan:  Patient for TCM. Admission note, discharge summary and instructions have been reviewed.  Patient admitted for headache, and found to have a left subacute subdural hematoma. Patient status post  Surgical intervention, prior to reversal of pradaxa. Patient doing very good, no complications from procedure. Patient will have follow up appointment with neurosurgery on 8/14 for    Chronic kidney disease, stage III (moderate) (HCC) 05/07/2016   Last Assessment & Plan:  Stable,  monitoring,   Chronic midline low back pain without sciatica 05/07/2016   Last  Assessment & Plan:  Formatting of this note might be different from the original. Stable, lorcet prescription renewed, last prescription issued 10/2016 for 90 tablets   Class 3 severe obesity due to excess calories with serious comorbidity and body mass index (BMI) of 40.0 to 44.9 in adult [facility]) 08/14/2017   Last Assessment & Plan:  Advised to start lifestyle changes: increasing physical activity at least 30 minutes 5 times a week, or as tolerated. Discussed low fat, low carb, small portion diet. Last Assessment & Plan:  Advised to start lifestyle changes: increasing physical activity at least 30 minutes 5 times a week, or as tolerated. Discussed low fat, low carb, small portion diet.  Formatting of t   COPD (chronic obstructive pulmonary disease) (HCC) 01/05/2016   Last Assessment & Plan:  Never smoked in the past nor present, but she has been diagnosed with COPD. Restarted home dose PRN Albuterol. Last Assessment & Plan:  Never smoked in the past nor present, but she has been diagnosed with COPD. Restarted home dose PRN Albuterol.   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  This is a  very pleasant 82 y.o.  who is status post left frontal parietal occipital bur holes for evacuation of chronic subdural hematoma by Dr. Lily Peer on 12/27/2016.  Patient states symptoms experienced prior to surgery have improved.  Patient denies vision changes, tinnitus, dysphagia, dysarthria, seizure activity, or loss of consciousness since    History of subdural hematoma 12/25/2016    Formatting of this note might be different from the original. Overview:  Last Assessment & Plan:  Patient has been doing well since her discharge from the hospital and since her recent surgery on 12/27/2016.  She denies all neurologic complaints.  She states that the right-sided weakness that she was experiencing prior to surgery has completely resolved.  She denies any headaches at this time.  Sh   Microcytic anemia 01/05/2016   Mixed hyperlipidemia 12/08/2018   Osteopenia of multiple sites 05/07/2016   Last Assessment & Plan:  Formatting of this note might be different from the original. Severe as per last BMD   Paroxysmal atrial fibrillation (HCC) 01/05/2016   Last Assessment & Plan:  Patient is rate controlled, sinus rhythm. On beta blocker, off anticoagulation and antiplatelets since contraindicated at this time due to recent subdural hematoma. Last Assessment & Plan:  Patient is rate controlled, sinus rhythm. On beta blocker, off anticoagulation and antiplatelets since contraindicated at this time due to recent subdural hematoma.  Formatting of this    Presence of Watchman left atrial appendage closure device 03/17/2018   Done in [facility] by Dr. 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 Anti-K Formatting of this note might be different from the original. 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  Otherwise, previous echocardiogram showed normal left ventricular systolic function.  As such, a cardiac etiology is less likely.  Recommendation:  No cardiac intervention is needed at the moment.  She should continue the COPD treatment and follow up with   Subdural hemorrhage (HCC) 12/25/2016   Last Assessment & Plan:  Patient has been doing well  since her discharge from the hospital and since her recent surgery on 12/27/2016.  She denies all neurologic complaints.  She states that the right-sided weakness that she was experiencing prior to surgery has completely resolved.  She denies any headaches at this time.  She denies any issues with loss of consciousness or seizure-like activity.    Uncomplicated opioid dependence (HCC) 07/24/2016   Last Assessment & Plan:  Referring to [facility] program On hydrocodone daily PRN, Last Prescription filled 07/24/16. No drug seeking behavior, sign of abuse or misuse    Past Surgical History:  Procedure Laterality Date   ABDOMINAL HYSTERECTOMY     BURR HOLE FOR SUBDURAL HEMATOMA     EYE SURGERY     TUBAL LIGATION      Current Medications: Current Meds  Medication Sig   albuterol (PROVENTIL HFA;VENTOLIN HFA) 108 (90 Base) MCG/ACT inhaler Inhale 2 puffs into the lungs every 6 (six) hours as needed for wheezing or shortness of breath.   aspirin EC 81 MG tablet Take 1 tablet (81 mg total) by mouth daily.   atorvastatin (LIPITOR) 20 MG tablet Take 20 mg by mouth daily.   carvedilol (COREG) 12.5 MG tablet Take 1 tablet  by mouth 2 (two) times daily.   cetirizine (ZYRTEC) 10 MG tablet Take 1 tablet by mouth daily.   Cholecalciferol 25 MCG (1000 UT) tablet Take 2,000 Units by mouth 2 (two) times daily.   diclofenac Sodium (VOLTAREN) 1 % GEL Apply 2 g topically every 6 (six) hours as needed (arthritis).   digoxin (LANOXIN) 0.125 MG tablet Take 1 tablet (125 mcg total) by mouth every other day.   docusate sodium (COLACE) 100 MG capsule Take 1 capsule by mouth as needed for mild constipation or moderate constipation.   DULoxetine (CYMBALTA) 30 MG capsule Take 30 mg by mouth daily.   ferrous sulfate 325 (65 FE) MG tablet Take 1 tablet by mouth daily.   fluticasone (FLONASE) 50 MCG/ACT nasal spray Place 1 spray into the nose as needed for allergies.   furosemide (LASIX) 20 MG tablet Take 1 tablet by mouth 2  (two) times daily.   lisinopril (PRINIVIL,ZESTRIL) 20 MG tablet Take 1 tablet by mouth daily.   Multiple Vitamins-Minerals (MULTIVITAMIN ADULT PO) Take 1 tablet by mouth daily. Unknown strength   pantoprazole (PROTONIX) 20 MG tablet Take 1 tablet by mouth daily.   polyvinyl alcohol (LIQUIFILM TEARS) 1.4 % ophthalmic solution Place 2 drops into both eyes as needed for dry eyes.   potassium chloride (K-DUR) 10 MEQ tablet Take 1 tablet by mouth daily.     Allergies:   Penicillins, Bee pollen, Nsaids, and Pollen extract   Social History   Socioeconomic History   Marital status: Single    Spouse name: Not on file   Number of children: Not on file   Years of education: Not on file   Highest education level: Not on file  Occupational History   Not on file  Tobacco Use   Smoking status: Never    Passive exposure: Never   Smokeless tobacco: Never  Vaping Use   Vaping status: Never Used  Substance and Sexual Activity   Alcohol use: Never   Drug use: Never   Sexual activity: Not on file  Other Topics Concern   Not on file  Social History Narrative   Not on file   Social Determinants of Health   Financial Resource Strain: Not on file  Food Insecurity: Low Risk  (09/04/2022)   Received from [facility]   Hunger Vital Sign    Worried About Running Out of Food in the Last Year: Never true    Ran Out of Food in the Last Year: Never true  Transportation Needs: Not on file (09/04/2022)  Physical Activity: Not on file  Stress: Not on file  Social Connections: Not on file     Family History: The patient's family history includes Heart disease in her father, mother, and sister; Hypertension in her mother and sister; Stroke in her mother. ROS:   Please see the history of present illness.    All 14 point review of systems negative except as described per history of present illness  EKGs/Labs/Other Studies Reviewed:         Recent Labs: No results found for requested labs within  last 365 days.  Recent Lipid Panel No results found for: "CHOL", "TRIG", "HDL", "CHOLHDL", "VLDL", "LDLCALC", "LDLDIRECT"  Physical Exam:    VS:  BP 132/77 (BP Location: Left Arm, Patient Position: Sitting, Cuff Size: Large)   Pulse 65   Ht 5\' 5"  (1.651 m)   Wt 234 lb (106.1 kg)   SpO2 97%   BMI 38.94 kg/m  Wt Readings from Last 3 Encounters:  05/08/23 234 lb (106.1 kg)  04/30/23 234 lb 0.4 oz (106.2 kg)  08/23/22 251 lb (113.9 kg)     GEN:  Well nourished, well developed in no acute distress HEENT: Normal NECK: No JVD; No carotid bruits LYMPHATICS: No lymphadenopathy CARDIAC: RRR, no murmurs, no rubs, no gallops RESPIRATORY:  Clear to auscultation without rales, wheezing or rhonchi  ABDOMEN: Soft, non-tender, non-distended MUSCULOSKELETAL:  No edema; No deformity  SKIN: Warm and dry LOWER EXTREMITIES: no swelling NEUROLOGIC:  Alert and oriented x 3 PSYCHIATRIC:  Normal affect   ASSESSMENT:    1. Paroxysmal atrial fibrillation (HCC)   2. Coronary artery disease involving native coronary artery of native heart without angina pectoris   3. Chronic obstructive pulmonary disease, unspecified COPD type (HCC)   4. Chronic intracranial subdural hematoma (HCC)   5. Presence of Watchman left atrial appendage closure device    PLAN:    In order of problems listed above:  Paroxysmal atrial fibrillation I will ask her to add Zio patch for 2 weeks to see if she has any recurrences of atrial fibrillation and if that is the case we will consider antiarrhythmic therapy.  She is not anticoagulated secondary to subdural hematomas she does have Watchman device. Coronary disease denies have any signs and symptoms that would indicate reactivation of the problem. COPD.  Stable. Dyslipidemia she is on Lipitor 20 however I do not have any recent fasting lipid profile, will contact primary care physician to try to get a copy of it   Medication Adjustments/Labs and Tests Ordered: Current  medicines are reviewed at length with the patient today.  Concerns regarding medicines are outlined above.  No orders of the defined types were placed in this encounter.  Medication changes: No orders of the defined types were placed in this encounter.   Signed, Georgeanna Lea, MD, [facility] 05/08/2023 1:37 PM    [facility]

## 2023-05-08 NOTE — Patient Instructions (Addendum)
Medication Instructions:  Your physician recommends that you continue on your current medications as directed. Please refer to the Current Medication list given to you today.  *If you need a refill on your cardiac medications before your next appointment, please call your pharmacy*   Lab Work: None Ordered If you have labs (blood work) drawn today and your tests are completely normal, you will receive your results only by: MyChart Message (if you have MyChart) OR A paper copy in the mail If you have any lab test that is abnormal or we need to change your treatment, we will call you to review the results.   Testing/Procedures:  WHY IS MY DOCTOR PRESCRIBING ZIO? The Zio system is proven and trusted by physicians to detect and diagnose irregular heart rhythms -- and has been prescribed to hundreds of thousands of patients.  The FDA has cleared the Zio system to monitor for many different kinds of irregular heart rhythms. In a study, physicians were able to reach a diagnosis 90% of the time with the Zio system1.  You can wear the Zio monitor -- a small, discreet, comfortable patch -- during your normal day-to-day activity, including while you sleep, shower, and exercise, while it records every single heartbeat for analysis.  1Barrett, P., et al. Comparison of 24 Hour Holter Monitoring Versus 14 Day Novel Adhesive Patch Electrocardiographic Monitoring. American Journal of Medicine, 2014.  ZIO VS. HOLTER MONITORING The Zio monitor can be comfortably worn for up to 14 days. Holter monitors can be worn for 24 to 48 hours, limiting the time to record any irregular heart rhythms you may have. Zio is able to capture data for the 51% of patients who have their first symptom-triggered arrhythmia after 48 hours.1  LIVE WITHOUT RESTRICTIONS The Zio ambulatory cardiac monitor is a small, unobtrusive, and water-resistant patch--you might even forget you're wearing it. The Zio monitor records and stores  every beat of your heart, whether you're sleeping, working out, or showering.     Follow-Up: At [facility], you and your health needs are our priority.  As part of our continuing mission to provide you with exceptional heart care, we have created designated Provider Care Teams.  These Care Teams include your primary Cardiologist (physician) and Advanced Practice Providers (APPs -  Physician Assistants and Nurse Practitioners) who all work together to provide you with the care you need, when you need it.  We recommend signing up for the patient portal called "MyChart".  Sign up information is provided on this After Visit Summary.  MyChart is used to connect with patients for Virtual Visits (Telemedicine).  Patients are able to view lab/test results, encounter notes, upcoming appointments, etc.  Non-urgent messages can be sent to your provider as well.   To learn more about what you can do with MyChart, go to [email or web address]://[email or web address].com.    Your next appointment:   6 month(s)  The format for your next appointment:   In Person  Provider:   Robert Krasowski, MD    Other Instructions NA  

## 2023-06-11 DIAGNOSIS — G4733 Obstructive sleep apnea (adult) (pediatric): Secondary | ICD-10-CM | POA: Insufficient documentation

## 2023-06-18 ENCOUNTER — Telehealth: Payer: Self-pay

## 2023-06-18 NOTE — Telephone Encounter (Signed)
 LVM for pt to call to let us  know if she is symptomatic with SVT- if she is Dr. Gordan Latina would like her to make appt to follow up. If not 6 mo appt for follow up.

## 2023-06-19 NOTE — Telephone Encounter (Signed)
 Pt returning call, requesting cb

## 2023-06-24 NOTE — Telephone Encounter (Signed)
Called pt to discuss monitor results. Pt reported no symptoms with SVT. Per Dr. Bing Matter she can follow up in 6 month unless she becomes symptomatic. Pt verbalized understanding and had no further questions.

## 2023-12-13 DIAGNOSIS — R7303 Prediabetes: Secondary | ICD-10-CM | POA: Insufficient documentation

## 2024-01-16 ENCOUNTER — Ambulatory Visit: Admitting: Cardiology

## 2024-01-26 ENCOUNTER — Ambulatory Visit: Attending: Cardiology | Admitting: Cardiology

## 2024-01-26 ENCOUNTER — Encounter: Payer: Self-pay | Admitting: Cardiology

## 2024-01-26 VITALS — BP 126/80 | HR 68 | Resp 18 | Ht 65.5 in | Wt 233.0 lb

## 2024-01-26 DIAGNOSIS — Z95818 Presence of other cardiac implants and grafts: Secondary | ICD-10-CM

## 2024-01-26 DIAGNOSIS — I2510 Atherosclerotic heart disease of native coronary artery without angina pectoris: Secondary | ICD-10-CM | POA: Diagnosis not present

## 2024-01-26 DIAGNOSIS — I480 Paroxysmal atrial fibrillation: Secondary | ICD-10-CM

## 2024-01-26 DIAGNOSIS — I10 Essential (primary) hypertension: Secondary | ICD-10-CM | POA: Diagnosis not present

## 2024-01-26 NOTE — Patient Instructions (Signed)

## 2024-01-26 NOTE — Addendum Note (Signed)
 Addended by: ARLOA PLANAS D on: 01/26/2024 04:48 PM   Modules accepted: Orders

## 2024-01-26 NOTE — Progress Notes (Signed)
 Cardiology Office Note:    Date:  01/26/2024   ID:  Kristina Mcguire, DOB 07-07-1940, MRN [id number]  PCP:  Artis Roderick Kay, PA-C  Cardiologist:  Lamar Fitch, MD    Referring MD: Artis Roderick Kay, *   Chief Complaint  Patient presents with   Chest Pain    History of Present Illness:    Kristina Mcguire is a 83 y.o. female past medical history significant for paroxysmal atrial fibrillation, CHADS2 Vascor equals 5 however she is not anticoagulated secondary to falls and subdural hematoma she does have Watchman device, essential hypertension, remote coronary artery disease, dyslipidemia.  She comes today to my office of follow-up.  Overall she is doing fine but did complain of having some shortness of breath.  She was given some new spray but that does not seems to be helping.  Denies have any chest pain tightness squeezing pressure burning chest very short episode of palpitation few weeks ago but otherwise fine  Past Medical History:  Diagnosis Date   Acute on chronic diastolic congestive heart failure (HCC) 12/27/2016   Benign essential hypertension 01/05/2016   Last Assessment & Plan:  At goal controlled continue same medications DASH diet reinforced   Chronic intracranial subdural hematoma (HCC) 12/26/2016   Last Assessment & Plan:  Patient for TCM. Admission note, discharge summary and instructions have been reviewed.  Patient admitted for headache, and found to have a left subacute subdural hematoma. Patient status post  Surgical intervention, prior to reversal of pradaxa. Patient doing very good, no complications from procedure. Patient will have follow up appointment with neurosurgery on 8/14 for    Chronic kidney disease, stage III (moderate) (HCC) 05/07/2016   Last Assessment & Plan:  Stable,  monitoring,   Chronic midline low back pain without sciatica 05/07/2016   Last Assessment & Plan:  Formatting of this note might be different from the original. Stable, lorcet  prescription renewed, last prescription issued 10/2016 for 90 tablets   Class 3 severe obesity due to excess calories with serious comorbidity and body mass index (BMI) of 40.0 to 44.9 in adult 08/14/2017   Last Assessment & Plan:  Advised to start lifestyle changes: increasing physical activity at least 30 minutes 5 times a week, or as tolerated. Discussed low fat, low carb, small portion diet. Last Assessment & Plan:  Advised to start lifestyle changes: increasing physical activity at least 30 minutes 5 times a week, or as tolerated. Discussed low fat, low carb, small portion diet.  Formatting of t   COPD (chronic obstructive pulmonary disease) (HCC) 01/05/2016   Last Assessment & Plan:  Never smoked in the past nor present, but she has been diagnosed with COPD. Restarted home dose PRN Albuterol . Last Assessment & Plan:  Never smoked in the past nor present, but she has been diagnosed with COPD. Restarted home dose PRN Albuterol .   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  This is a very pleasant 83 y.o.  who is status post left frontal parietal occipital bur  holes for evacuation of chronic subdural hematoma by Dr. Clerance on 12/27/2016.  Patient states symptoms experienced prior to surgery have improved.  Patient denies vision changes, tinnitus, dysphagia, dysarthria, seizure activity, or loss of consciousness since    History of subdural hematoma 12/25/2016   Formatting of this note might be different from the original. Overview:  Last Assessment & Plan:  Patient has  been doing well since her discharge from the hospital and since her recent surgery on 12/27/2016.  She denies all neurologic complaints.  She states that the right-sided weakness that she was experiencing prior to surgery has completely resolved.  She denies any headaches at this time.  Sh   Microcytic anemia 01/05/2016   Mixed hyperlipidemia 12/08/2018   Osteopenia of multiple sites 05/07/2016   Last Assessment & Plan:  Formatting of this note might be different from the original. Severe as per last BMD   Paroxysmal atrial fibrillation (HCC) 01/05/2016   Last Assessment & Plan:  Patient is rate controlled, sinus rhythm. On beta blocker, off anticoagulation and antiplatelets since contraindicated at this time due to recent subdural hematoma. Last Assessment & Plan:  Patient is rate controlled, sinus rhythm. On beta blocker, off anticoagulation and antiplatelets since contraindicated at this time due to recent subdural hematoma.  Formatting of this    Presence of Watchman left atrial appendage closure device 03/17/2018   Done in [facility] by Dr. 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 Anti-K Formatting of this note might be different from the original. 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  Otherwise, previous echocardiogram showed normal left ventricular systolic function.  As such, a cardiac etiology is less likely.  Recommendation:  No cardiac intervention is needed at the moment.  She should continue the COPD treatment and follow up with   Subdural hemorrhage (HCC) 12/25/2016   Last Assessment & Plan:  Patient has been doing well since her discharge from the hospital and since her recent surgery on 12/27/2016.  She denies all neurologic  complaints.  She states that the right-sided weakness that she was experiencing prior to surgery has completely resolved.  She denies any headaches at this time.  She denies any issues with loss of consciousness or seizure-like activity.    Uncomplicated opioid dependence (HCC) 07/24/2016   Last Assessment & Plan:  Referring to [facility] program On hydrocodone  daily PRN, Last Prescription filled 07/24/16. No drug seeking behavior, sign of abuse or misuse    Past Surgical History:  Procedure Laterality Date   ABDOMINAL HYSTERECTOMY     BURR HOLE FOR SUBDURAL HEMATOMA     EYE SURGERY     TUBAL LIGATION      Current Medications: Current Meds  Medication Sig   albuterol  (PROVENTIL  HFA;VENTOLIN  HFA) 108 (90 Base) MCG/ACT inhaler Inhale 2 puffs into the lungs every 6 (six) hours as needed for wheezing or shortness of breath.   aspirin  EC 81 MG tablet Take 1 tablet (81 mg total) by mouth daily.   atorvastatin (LIPITOR) 20 MG tablet Take 20 mg by mouth daily.   carvedilol (COREG) 12.5 MG tablet Take 1 tablet by mouth 2 (two) times daily.   cetirizine (ZYRTEC) 10 MG tablet Take  1 tablet by mouth daily.   Cholecalciferol 25 MCG (1000 UT) tablet Take 2,000 Units by mouth 2 (two) times daily.   diclofenac Sodium (VOLTAREN) 1 % GEL Apply 2 g topically every 6 (six) hours as needed (arthritis).   digoxin  (LANOXIN ) 0.125 MG tablet Take 1 tablet (125 mcg total) by mouth every other day.   docusate sodium (COLACE) 100 MG capsule Take 1 capsule by mouth as needed for mild constipation or moderate constipation.   DULoxetine (CYMBALTA) 30 MG capsule Take 30 mg by mouth daily.   ferrous sulfate 325 (65 FE) MG tablet Take 1 tablet by mouth daily.   fluticasone (FLONASE) 50 MCG/ACT nasal spray Place 1 spray into the nose as needed for allergies.   furosemide (LASIX) 20 MG tablet Take 1 tablet by mouth 2 (two) times daily.   lisinopril (PRINIVIL,ZESTRIL) 20 MG tablet Take 1 tablet by mouth daily.   Multiple  Vitamins-Minerals (MULTIVITAMIN ADULT PO) Take 1 tablet by mouth daily. Unknown strength   pantoprazole (PROTONIX) 20 MG tablet Take 1 tablet by mouth daily.   polyvinyl alcohol (LIQUIFILM TEARS) 1.4 % ophthalmic solution Place 2 drops into both eyes as needed for dry eyes.   potassium chloride (K-DUR) 10 MEQ tablet Take 1 tablet by mouth daily.     Allergies:   Penicillins, Bee pollen, Nsaids, and Pollen extract   Social History   Socioeconomic History   Marital status: Single    Spouse name: Not on file   Number of children: Not on file   Years of education: Not on file   Highest education level: Not on file  Occupational History   Not on file  Tobacco Use   Smoking status: Never    Passive exposure: Never   Smokeless tobacco: Never  Vaping Use   Vaping status: Never Used  Substance and Sexual Activity   Alcohol use: Never   Drug use: Never   Sexual activity: Not on file  Other Topics Concern   Not on file  Social History Narrative   Not on file   Social Drivers of Health   Financial Resource Strain: Not on file  Food Insecurity: Low Risk  (06/11/2023)   Received from [facility]   Hunger Vital Sign    Within the past 12 months, you worried that your food would run out before you got money to buy more: Never true    Within the past 12 months, the food you bought just didn't last and you didn't have money to get more. : Never true  Transportation Needs: No Transportation Needs (06/11/2023)   Received from [facility]    In the past 12 months, has lack of reliable transportation kept you from medical appointments, meetings, work or from getting things needed for daily living? : No  Physical Activity: Not on file  Stress: Not on file  Social Connections: Not on file     Family History: The patient's family history includes Heart disease in her father, mother, and sister; Hypertension in her mother and sister; Stroke in her mother. ROS:   Please see  the history of present illness.    All 14 point review of systems negative except as described per history of present illness  EKGs/Labs/Other Studies Reviewed:    EKG Interpretation Date/Time:  Monday January 26 2024 16:20:45 EDT Ventricular Rate:  68 PR Interval:  226 QRS Duration:  88 QT Interval:  358 QTC Calculation: 380 R Axis:   -31  Text Interpretation: Sinus rhythm with sinus arrhythmia with 1st degree A-V block Left axis deviation Nonspecific T wave abnormality When compared with ECG of 30-Apr-2023 13:01, No significant change was found Confirmed by Bernie Charleston [phone]) on 01/26/2024 4:34:07 PM    Recent Labs: No results found for requested labs within last 365 days.  Recent Lipid Panel No results found for: CHOL, TRIG, HDL, CHOLHDL, VLDL, LDLCALC, LDLDIRECT  Physical Exam:    VS:  BP 126/80 (BP Location: Left Arm, Patient Position: Sitting, Cuff Size: Normal)   Pulse 68   Resp 18   Ht 5' 5.5 (1.664 m)   Wt 233 lb (105.7 kg)   SpO2 95%   BMI 38.18 kg/m     Wt Readings from Last 3 Encounters:  01/26/24 233 lb (105.7 kg)  05/08/23 234 lb (106.1 kg)  04/30/23 234 lb 0.4 oz (106.2 kg)     GEN:  Well nourished, well developed in no acute distress HEENT: Normal NECK: No JVD; No carotid bruits LYMPHATICS: No lymphadenopathy CARDIAC: RRR, no murmurs, no rubs, no gallops RESPIRATORY:  Clear to auscultation without rales, wheezing or rhonchi  ABDOMEN: Soft, non-tender, non-distended MUSCULOSKELETAL:  No edema; No deformity  SKIN: Warm and dry LOWER EXTREMITIES: no swelling NEUROLOGIC:  Alert and oriented x 3 PSYCHIATRIC:  Normal affect   ASSESSMENT:    1. Paroxysmal atrial fibrillation (HCC)   2. Benign essential hypertension   3. Coronary artery disease involving native coronary artery of native heart without angina pectoris   4. Presence of Watchman left atrial appendage closure device    PLAN:    In order of problems listed  above:  Paroxysmal atrial fibrillation maintained sinus rhythm today.  She is only on aspirin  secondary to history of subdural hematoma, she does have a Watchman device. Benign essential hypertension blood pressure well-controlled continue present management. Coronary disease.  Stable from that point we will continue present management. Watchman device present.  Noted.   Medication Adjustments/Labs and Tests Ordered: Current medicines are reviewed at length with the patient today.  Concerns regarding medicines are outlined above.  Orders Placed This Encounter  Procedures   EKG 12-Lead   Medication changes: No orders of the defined types were placed in this encounter.   Signed, [place] DOROTHA Bernie, MD, [facility] 01/26/2024 4:41 PM    [facility]

## 2024-02-23 ENCOUNTER — Ambulatory Visit (HOSPITAL_BASED_OUTPATIENT_CLINIC_OR_DEPARTMENT_OTHER)
Admission: RE | Admit: 2024-02-23 | Discharge: 2024-02-23 | Disposition: A | Discharge status: Home / Self Care | Source: Ambulatory Visit | Attending: Cardiology | Admitting: Cardiology

## 2024-02-23 DIAGNOSIS — I480 Paroxysmal atrial fibrillation: Secondary | ICD-10-CM | POA: Diagnosis present

## 2024-02-23 LAB — ECHOCARDIOGRAM COMPLETE
AR max vel: 1.96 cm2
AV Area VTI: 1.81 cm2
AV Area mean vel: 1.69 cm2
AV Mean grad: 4 mmHg
AV Peak grad: 6.4 mmHg
AV Vena cont: 0.2 cm
Ao pk vel: 1.26 m/s
Area-P 1/2: 2.56 cm2
Calc EF: 58.5 %
MV M vel: 2.19 m/s
MV Peak grad: 19.2 mmHg
S' Lateral: 3 cm
Single Plane A2C EF: 60 %
Single Plane A4C EF: 63.1 %

## 2024-02-24 ENCOUNTER — Ambulatory Visit: Payer: Self-pay | Admitting: Cardiology

## 2024-02-26 ENCOUNTER — Telehealth: Payer: Self-pay

## 2024-02-26 NOTE — Telephone Encounter (Signed)
 Pt viewed Echo results on My Chart per Dr. Vanetta Shawl note. Routed to PCP.

## 2024-02-26 NOTE — Telephone Encounter (Signed)
 Left message on My Chart with Echo results per Dr. Karry note. Routed to PCP.

## 2024-03-09 ENCOUNTER — Emergency Department (HOSPITAL_BASED_OUTPATIENT_CLINIC_OR_DEPARTMENT_OTHER)
Admission: EM | Admit: 2024-03-09 | Discharge: 2024-03-10 | Disposition: A | Discharge status: Home / Self Care | Attending: Emergency Medicine | Admitting: Emergency Medicine

## 2024-03-09 ENCOUNTER — Encounter (HOSPITAL_BASED_OUTPATIENT_CLINIC_OR_DEPARTMENT_OTHER): Payer: Self-pay | Admitting: Emergency Medicine

## 2024-03-09 ENCOUNTER — Other Ambulatory Visit: Payer: Self-pay

## 2024-03-09 ENCOUNTER — Emergency Department (HOSPITAL_BASED_OUTPATIENT_CLINIC_OR_DEPARTMENT_OTHER)

## 2024-03-09 DIAGNOSIS — I5030 Unspecified diastolic (congestive) heart failure: Secondary | ICD-10-CM | POA: Insufficient documentation

## 2024-03-09 DIAGNOSIS — R110 Nausea: Secondary | ICD-10-CM | POA: Diagnosis not present

## 2024-03-09 DIAGNOSIS — Z7982 Long term (current) use of aspirin: Secondary | ICD-10-CM | POA: Insufficient documentation

## 2024-03-09 DIAGNOSIS — N189 Chronic kidney disease, unspecified: Secondary | ICD-10-CM | POA: Diagnosis not present

## 2024-03-09 DIAGNOSIS — R0600 Dyspnea, unspecified: Secondary | ICD-10-CM | POA: Diagnosis not present

## 2024-03-09 DIAGNOSIS — R42 Dizziness and giddiness: Secondary | ICD-10-CM | POA: Diagnosis not present

## 2024-03-09 DIAGNOSIS — I130 Hypertensive heart and chronic kidney disease with heart failure and stage 1 through stage 4 chronic kidney disease, or unspecified chronic kidney disease: Secondary | ICD-10-CM | POA: Insufficient documentation

## 2024-03-09 DIAGNOSIS — I2510 Atherosclerotic heart disease of native coronary artery without angina pectoris: Secondary | ICD-10-CM | POA: Insufficient documentation

## 2024-03-09 DIAGNOSIS — R079 Chest pain, unspecified: Secondary | ICD-10-CM | POA: Diagnosis present

## 2024-03-09 DIAGNOSIS — R55 Syncope and collapse: Secondary | ICD-10-CM | POA: Insufficient documentation

## 2024-03-09 DIAGNOSIS — Z79899 Other long term (current) drug therapy: Secondary | ICD-10-CM | POA: Insufficient documentation

## 2024-03-09 DIAGNOSIS — N289 Disorder of kidney and ureter, unspecified: Secondary | ICD-10-CM

## 2024-03-09 LAB — CBC
HCT: 41.3 % (ref 36.0–46.0)
Hemoglobin: 13 g/dL (ref 12.0–15.0)
MCH: 26.4 pg (ref 26.0–34.0)
MCHC: 31.5 g/dL (ref 30.0–36.0)
MCV: 83.9 fL (ref 80.0–100.0)
Platelets: 167 K/uL (ref 150–400)
RBC: 4.92 MIL/uL (ref 3.87–5.11)
RDW: 15.5 % (ref 11.5–15.5)
WBC: 11.3 K/uL — ABNORMAL HIGH (ref 4.0–10.5)
nRBC: 0 % (ref 0.0–0.2)

## 2024-03-09 NOTE — ED Provider Notes (Signed)
 [place] [facility] Provider Note   CSN: [id number] Arrival date & time: 03/09/24  1613     Patient presents with: Chest Pain and Near Syncope   Kristina Mcguire is a 83 y.o. female.  {Add pertinent medical, surgical, social history, OB history to YEP:67052} The history is provided by the patient.  Chest Pain Associated symptoms: near-syncope   Near Syncope Associated symptoms include chest pain.   She has history of hypertension, hyperlipidemia, coronary artery disease, paroxysmal atrial fibrillation status post Watchman procedure, chronic kidney disease, diastolic heart failure and had an episode this afternoon I have tightness in her chest with associated dyspnea and nausea but no diaphoresis.  She felt lightheaded but did not pass out.  Episode lasted about 30 minutes before resolving.  She is a non-smoker.    Prior to Admission medications   Medication Sig Start Date End Date Taking? Authorizing Provider  albuterol  (PROVENTIL  HFA;VENTOLIN  HFA) 108 (90 Base) MCG/ACT inhaler Inhale 2 puffs into the lungs every 6 (six) hours as needed for wheezing or shortness of breath.    [provider]  aspirin  EC 81 MG tablet Take 1 tablet (81 mg total) by mouth daily. 11/13/18   Krasowski, Robert J, MD  atorvastatin (LIPITOR) 20 MG tablet Take 20 mg by mouth daily. 02/12/17   [provider]  carvedilol (COREG) 12.5 MG tablet Take 1 tablet by mouth 2 (two) times daily. 02/07/17   [provider]  cetirizine (ZYRTEC) 10 MG tablet Take 1 tablet by mouth daily. 11/07/16   [provider]  Cholecalciferol 25 MCG (1000 UT) tablet Take 2,000 Units by mouth 2 (two) times daily.    [provider]  diclofenac Sodium (VOLTAREN) 1 % GEL Apply 2 g topically every 6 (six) hours as needed (arthritis). 09/28/19   [provider]  digoxin  (LANOXIN ) 0.125 MG tablet Take 1 tablet (125 mcg total) by mouth every other day.  07/24/18   Krasowski, Robert J, MD  docusate sodium (COLACE) 100 MG capsule Take 1 capsule by mouth as needed for mild constipation or moderate constipation. 12/31/16   [provider]  DULoxetine (CYMBALTA) 30 MG capsule Take 30 mg by mouth daily. 12/19/19   [provider]  ferrous sulfate 325 (65 FE) MG tablet Take 1 tablet by mouth daily.    [provider]  fluticasone (FLONASE) 50 MCG/ACT nasal spray Place 1 spray into the nose as needed for allergies. 03/19/16   [provider]  furosemide (LASIX) 20 MG tablet Take 1 tablet by mouth 2 (two) times daily. 11/07/16   [provider]  lisinopril (PRINIVIL,ZESTRIL) 20 MG tablet Take 1 tablet by mouth daily. 02/07/17   [provider]  Multiple Vitamins-Minerals (MULTIVITAMIN ADULT PO) Take 1 tablet by mouth daily. Unknown strength    [provider]  pantoprazole (PROTONIX) 20 MG tablet Take 1 tablet by mouth daily. 02/12/17   [provider]  polyvinyl alcohol (LIQUIFILM TEARS) 1.4 % ophthalmic solution Place 2 drops into both eyes as needed for dry eyes.    [provider]  potassium chloride (K-DUR) 10 MEQ tablet Take 1 tablet by mouth daily. 03/25/17   [provider]    Allergies: Penicillins, Bee pollen, Nsaids, and Pollen extract    Review of Systems  Cardiovascular:  Positive for chest pain and near-syncope.  All other systems reviewed and are negative.   Updated Vital Signs BP 130/67 (BP Location: Right Arm)  Pulse 63   Temp 98.2 F (36.8 C) (Oral)   Resp 19   Ht 5' 5.5 (1.664 m)   Wt 105.7 kg   SpO2 99%   BMI 38.18 kg/m   Physical Exam Vitals and nursing note reviewed.   83 year old female, resting comfortably and in no acute distress. Vital signs are normal. Oxygen saturation is 99%, which is normal. Head is normocephalic and atraumatic. PERRLA, EOMI.  Lungs are clear without rales, wheezes, or rhonchi. Chest is nontender. Heart has  regular rate and rhythm without murmur. Abdomen is soft, flat, nontender. Extremities have no cyanosis or edema.. Skin is warm and dry without rash. Neurologic: Mental status is normal, cranial nerves are intact, moves all extremities equally.  (all labs ordered are listed, but only abnormal results are displayed) Labs Reviewed  CBC - Abnormal; Notable for the following components:      Result Value   WBC 11.3 (*)    All other components within normal limits  BASIC METABOLIC PANEL WITH GFR  CBG MONITORING, ED  TROPONIN T, HIGH SENSITIVITY  TROPONIN T, HIGH SENSITIVITY    EKG: EKG Interpretation Date/Time:  Tuesday March 09 2024 16:25:39 EDT Ventricular Rate:  68 PR Interval:  155 QRS Duration:  99 QT Interval:  431 QTC Calculation: 459 R Axis:   -22  Text Interpretation: Sinus or ectopic atrial rhythm Borderline left axis deviation Abnormal R-wave progression, early transition Borderline T wave abnormalities When compared with ECG of 01/26/2024, No significant change was found Confirmed by Raford Lenis ([id number]) on 03/09/2024 11:38:41 PM  Radiology: [facility] Chest 2 View Result Date: 03/09/2024 CLINICAL DATA:  Chest pain and syncope. EXAM: CHEST - 2 VIEW COMPARISON:  September 06, 2022. FINDINGS: The heart size and mediastinal contours are within normal limits. Both lungs are clear. The visualized skeletal structures are unremarkable. IMPRESSION: No active cardiopulmonary disease. Electronically Signed   By: Lynwood Landy Raddle M.D.   On: 03/09/2024 16:56    {Document cardiac monitor, telemetry assessment procedure when appropriate:32947} Procedures  Cardiac monitor shows normal sinus rhythm, per my interpretation. Medications Ordered in the ED - No data to display    {Click here for ABCD2, HEART and other calculators REFRESH Note before signing:1}                              Medical Decision Making Amount and/or Complexity of Data Reviewed Labs: ordered. Radiology:  ordered.   Episode of chest pain which has resolved.  This is a presentation with a wide range of treatment options and carries with it a high risk of morbidity and complications.  Differential diagnosis includes, but is not limited to, angina, ACS, GERD, pulmonary embolism, pneumonia.  I have reviewed her electrocardiogram, my interpretation is nonspecific T wave changes unchanged from prior.  Chest x-ray shows no acute cardiopulmonary process.  I have independently viewed the images, and I agree with the radiologist's interpretation.  I have reviewed her CBC, my interpretation is mild leukocytosis which is nonspecific.  Because of difficulty in obtaining blood, there is a delay in obtaining sample for basic metabolic panel and troponin.  Because of this delay, the initial troponin is is functionally a second troponin and, if normal, will not require a follow-up troponin.  I have reviewed her past records and note echocardiogram on 02/23/2024 showed normal left ventricular ejection fraction with acute grade 1 diastolic dysfunction.  I have elected not to give  her aspirin  because of history of bleeding complications from NSAIDs.  {Document critical care time when appropriate  Document review of labs and clinical decision tools ie CHADS2VASC2, etc  Document your independent review of radiology images and any outside records  Document your discussion with family members, caretakers and with consultants  Document social determinants of health affecting pt's care  Document your decision making why or why not admission, treatments were needed:32947:::1}   Final diagnoses:  None    ED Discharge Orders     None

## 2024-03-09 NOTE — ED Triage Notes (Signed)
 Pt c/o dizziness, near syncope appx 45 min pta. C/o chest pain last week felt like heart burn.  Intermittent headache and BL arm pain. Denies at time of triage.   Denies recent change in medications. Denies chest pain, dizziness at time of triage.  Good po intake.

## 2024-03-10 LAB — BASIC METABOLIC PANEL WITH GFR
Anion gap: 11 (ref 5–15)
BUN: 15 mg/dL (ref 8–23)
CO2: 26 mmol/L (ref 22–32)
Calcium: 9.2 mg/dL (ref 8.9–10.3)
Chloride: 102 mmol/L (ref 98–111)
Creatinine, Ser: 1.12 mg/dL — ABNORMAL HIGH (ref 0.44–1.00)
GFR, Estimated: 49 mL/min — ABNORMAL LOW (ref >60)
Glucose, Bld: 92 mg/dL (ref 70–99)
Potassium: 4.4 mmol/L (ref 3.5–5.1)
Sodium: 138 mmol/L (ref 135–145)

## 2024-03-10 LAB — TROPONIN T, HIGH SENSITIVITY: Troponin T High Sensitivity: <15 ng/L (ref 0–19)

## 2024-03-10 MED ORDER — NITROGLYCERIN 0.4 MG SL SUBL: 0.4000 mg | SUBLINGUAL_TABLET | SUBLINGUAL | 0 refills | Status: AC | PRN

## 2024-03-10 NOTE — Discharge Instructions (Signed)
 If you ever have an episode of chest discomfort that does not get better with 3 nitroglycerin, come to the hospital immediately.

## 2024-03-12 LAB — BASIC METABOLIC PANEL WITH GFR

## 2024-03-18 ENCOUNTER — Encounter: Payer: Self-pay | Admitting: *Deleted

## 2024-03-18 ENCOUNTER — Encounter: Payer: Self-pay | Admitting: Cardiology

## 2024-03-18 ENCOUNTER — Ambulatory Visit: Attending: Cardiology | Admitting: Cardiology

## 2024-03-18 VITALS — BP 120/78 | HR 64 | Ht 65.5 in

## 2024-03-18 DIAGNOSIS — I2510 Atherosclerotic heart disease of native coronary artery without angina pectoris: Secondary | ICD-10-CM

## 2024-03-18 DIAGNOSIS — I10 Essential (primary) hypertension: Secondary | ICD-10-CM | POA: Diagnosis not present

## 2024-03-18 DIAGNOSIS — J449 Chronic obstructive pulmonary disease, unspecified: Secondary | ICD-10-CM | POA: Diagnosis not present

## 2024-03-18 DIAGNOSIS — I6203 Nontraumatic chronic subdural hemorrhage: Secondary | ICD-10-CM | POA: Diagnosis not present

## 2024-03-18 NOTE — Progress Notes (Signed)
 Cardiology Office Note:    Date:  03/18/2024   ID:  Kristina Mcguire, DOB November 03, 1940, MRN [id number]  PCP:  Artis Roderick Kay, PA-C  Cardiologist:  Lamar Fitch, MD    Referring MD: Raford Lenis, MD   No chief complaint on file. Diabetes of chest pain few days ago  History of Present Illness:    Kristina Mcguire is a 83 y.o. female past medical history significant for paroxysmal atrial fibrillation CHA2DS2-VASc of 5 she is not anticoagulated but does have Watchman device not anticoagulate because of falls and subdural hematoma, also dyslipidemia, remote coronary disease essential hypertension.  Comes today to my office after being seen in the emergency room she was working in the kitchen and started having some chest pain.  It lasted for about a minute or 2 according to her she ended going to the emergency room workup negative.  Since that time no more issues.  Past Medical History:  Diagnosis Date   Acute on chronic diastolic congestive heart failure (HCC) 12/27/2016   Benign essential hypertension 01/05/2016   Last Assessment & Plan:  At goal controlled continue same medications DASH diet reinforced   Chronic intracranial subdural hematoma (HCC) 12/26/2016   Last Assessment & Plan:  Patient for TCM. Admission note, discharge summary and instructions have been reviewed.  Patient admitted for headache, and found to have a left subacute subdural hematoma. Patient status post  Surgical intervention, prior to reversal of pradaxa. Patient doing very good, no complications from procedure. Patient will have follow up appointment with neurosurgery on 8/14 for    Chronic kidney disease, stage III (moderate) (HCC) 05/07/2016   Last Assessment & Plan:  Stable,  monitoring,   Chronic midline low back pain without sciatica 05/07/2016   Last Assessment & Plan:  Formatting of this note might be different from the original. Stable, lorcet prescription renewed, last prescription issued 10/2016 for 90  tablets   Class 3 severe obesity due to excess calories with serious comorbidity and body mass index (BMI) of 40.0 to 44.9 in adult [facility]) 08/14/2017   Last Assessment & Plan:  Advised to start lifestyle changes: increasing physical activity at least 30 minutes 5 times a week, or as tolerated. Discussed low fat, low carb, small portion diet. Last Assessment & Plan:  Advised to start lifestyle changes: increasing physical activity at least 30 minutes 5 times a week, or as tolerated. Discussed low fat, low carb, small portion diet.  Formatting of t   COPD (chronic obstructive pulmonary disease) (HCC) 01/05/2016   Last Assessment & Plan:  Never smoked in the past nor present, but she has been diagnosed with COPD. Restarted home dose PRN Albuterol . Last Assessment & Plan:  Never smoked in the past nor present, but she has been diagnosed with COPD. Restarted home dose PRN Albuterol .   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  This is a very pleasant 83 y.o.  who is status post left frontal parietal occipital bur holes for evacuation of chronic subdural hematoma by  Dr. Clerance on 12/27/2016.  Patient states symptoms experienced prior to surgery have improved.  Patient denies vision changes, tinnitus, dysphagia, dysarthria, seizure activity, or loss of consciousness since    History of subdural hematoma 12/25/2016   Formatting of this note might be different from the original. Overview:  Last Assessment & Plan:  Patient has been doing well since her discharge from the hospital  and since her recent surgery on 12/27/2016.  She denies all neurologic complaints.  She states that the right-sided weakness that she was experiencing prior to surgery has completely resolved.  She denies any headaches at this time.  Sh   Microcytic anemia 01/05/2016   Mixed hyperlipidemia 12/08/2018   Osteopenia of multiple sites 05/07/2016   Last Assessment & Plan:  Formatting of this note might be different from the original. Severe as per last BMD   Paroxysmal atrial fibrillation (HCC) 01/05/2016   Last Assessment & Plan:  Patient is rate controlled, sinus rhythm. On beta blocker, off anticoagulation and antiplatelets since contraindicated at this time due to recent subdural hematoma. Last Assessment & Plan:  Patient is rate controlled, sinus rhythm. On beta blocker, off anticoagulation and antiplatelets since contraindicated at this time due to recent subdural hematoma.  Formatting of this    Presence of Watchman left atrial appendage closure device 03/17/2018   Done in [facility] by Dr. 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 Anti-K Formatting of this note might be different from the original. 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  Otherwise, previous echocardiogram showed normal left ventricular systolic function.  As such, a cardiac etiology is less likely.  Recommendation:  No cardiac intervention is needed at the moment.  She should continue the COPD treatment and follow up with   Subdural hemorrhage (HCC) 12/25/2016   Last Assessment & Plan:  Patient has been doing well since her discharge from the hospital and since her recent surgery on 12/27/2016.  She denies all neurologic complaints.  She states that the right-sided weakness  that she was experiencing prior to surgery has completely resolved.  She denies any headaches at this time.  She denies any issues with loss of consciousness or seizure-like activity.    Uncomplicated opioid dependence (HCC) 07/24/2016   Last Assessment & Plan:  Referring to [facility] program On hydrocodone  daily PRN, Last Prescription filled 07/24/16. No drug seeking behavior, sign of abuse or misuse    Past Surgical History:  Procedure Laterality Date   ABDOMINAL HYSTERECTOMY     BURR HOLE FOR SUBDURAL HEMATOMA     EYE SURGERY     TUBAL LIGATION      Current Medications: Current Meds  Medication Sig   albuterol  (PROVENTIL  HFA;VENTOLIN  HFA) 108 (90 Base) MCG/ACT inhaler Inhale 2 puffs into the lungs every 6 (six) hours as needed for wheezing or shortness of breath.   aspirin  EC 81 MG tablet Take 1 tablet (81 mg total) by mouth daily.   atorvastatin (LIPITOR) 20 MG tablet Take 20 mg by mouth daily.   carvedilol (COREG) 12.5 MG tablet Take 1 tablet by mouth 2 (two) times daily.   cetirizine (ZYRTEC) 10 MG tablet Take 1 tablet by mouth daily.   Cholecalciferol  25 MCG (1000 UT) tablet Take 2,000 Units by mouth 2 (two) times daily.   diclofenac Sodium (VOLTAREN) 1 % GEL Apply 2 g topically every 6 (six) hours as needed (arthritis).   digoxin  (LANOXIN ) 0.125 MG tablet Take 1 tablet (125 mcg total) by mouth every other day.   docusate sodium (COLACE) 100 MG capsule Take 1 capsule by mouth as needed for mild constipation or moderate constipation.   DULoxetine (CYMBALTA) 30 MG capsule Take 30 mg by mouth daily.   ferrous sulfate 325 (65 FE) MG tablet Take 1 tablet by mouth daily.   fluticasone (FLONASE) 50 MCG/ACT nasal spray Place 1 spray into the nose as needed for allergies.   furosemide (LASIX) 20 MG tablet Take 1 tablet by mouth 2 (two) times daily.   lisinopril (PRINIVIL,ZESTRIL) 20 MG tablet Take 1 tablet by mouth daily.   magnesium oxide (MAG-OX) 400 (240 Mg) MG tablet Take 1 tablet by  mouth daily.   Multiple Vitamins-Minerals (MULTIVITAMIN ADULT PO) Take 1 tablet by mouth daily. Unknown strength   nitroGLYCERIN (NITROSTAT) 0.4 MG SL tablet Place 1 tablet (0.4 mg total) under the tongue every 5 (five) minutes as needed for chest pain.   pantoprazole (PROTONIX) 20 MG tablet Take 1 tablet by mouth daily.   polyvinyl alcohol (LIQUIFILM TEARS) 1.4 % ophthalmic solution Place 2 drops into both eyes as needed for dry eyes.   potassium chloride (K-DUR) 10 MEQ tablet Take 1 tablet by mouth daily.     Allergies:   Penicillins, Bee pollen, Nsaids, and Pollen extract   Social History   Socioeconomic History   Marital status: Single    Spouse name: Not on file   Number of children: Not on file   Years of education: Not on file   Highest education level: Not on file  Occupational History   Not on file  Tobacco Use   Smoking status: Never    Passive exposure: Never   Smokeless tobacco: Never  Vaping Use   Vaping status: Never Used  Substance and Sexual Activity   Alcohol use: Never   Drug use: Never   Sexual activity: Not on file  Other Topics Concern   Not on file  Social History Narrative   Not on file   Social Drivers of Health   Financial Resource Strain: Not on file  Food Insecurity: Low Risk  (06/11/2023)   Received from [facility]   Hunger Vital Sign    Within the past 12 months, you worried that your food would run out before you got money to buy more: Never true    Within the past 12 months, the food you bought just didn't last and you didn't have money to get more. : Never true  Transportation Needs: No Transportation Needs (06/11/2023)   Received from [facility]    In the past 12 months, has lack of reliable transportation kept you from medical appointments, meetings, work or from getting things needed for daily living? : No  Physical Activity: Not on file  Stress: Not on file  Social Connections: Not on file     Family  History: The patient's family history includes Heart disease in her father, mother, and sister; Hypertension in her mother and sister; Stroke in her mother. ROS:   Please see the history of present illness.    All 14 point review of systems negative except as described per history of present illness  EKGs/Labs/Other Studies Reviewed:    EKG  Interpretation Date/Time:  Thursday March 18 2024 10:36:02 EDT Ventricular Rate:  64 PR Interval:  224 QRS Duration:  92 QT Interval:  364 QTC Calculation: 375 R Axis:   7  Text Interpretation: Sinus rhythm with sinus arrhythmia with 1st degree A-V block When compared with ECG of 09-Mar-2024 16:25, PREVIOUS ECG IS PRESENT Confirmed by Bernie Charleston [phone]) on 03/18/2024 10:53:19 AM    Recent Labs: 03/09/2024: BUN 15; Creatinine, Ser 1.12; Hemoglobin 13.0; Platelets 167; Potassium 4.4; Sodium 138  Recent Lipid Panel No results found for: CHOL, TRIG, HDL, CHOLHDL, VLDL, LDLCALC, LDLDIRECT  Physical Exam:    VS:  BP 120/78   Pulse 64   Ht 5' 5.5 (1.664 m)   SpO2 99%   BMI 38.18 kg/m     Wt Readings from Last 3 Encounters:  03/09/24 233 lb (105.7 kg)  01/26/24 233 lb (105.7 kg)  05/08/23 234 lb (106.1 kg)     GEN:  Well nourished, well developed in no acute distress HEENT: Normal NECK: No JVD; No carotid bruits LYMPHATICS: No lymphadenopathy CARDIAC: RRR, no murmurs, no rubs, no gallops RESPIRATORY:  Clear to auscultation without rales, wheezing or rhonchi  ABDOMEN: Soft, non-tender, non-distended MUSCULOSKELETAL:  No edema; No deformity  SKIN: Warm and dry LOWER EXTREMITIES: no swelling NEUROLOGIC:  Alert and oriented x 3 PSYCHIATRIC:  Normal affect   ASSESSMENT:    1. Benign essential hypertension   2. Coronary artery disease involving native coronary artery of native heart without angina pectoris   3. Chronic obstructive pulmonary disease, unspecified COPD type (HCC)   4. Chronic intracranial subdural  hematoma (HCC)    PLAN:    In order of problems listed above:  Chest pain with some concerning characteristics.  We talked about how to approach this favor a conservative approach for now we will continue monitoring her she does have prescription for nitroglycerin ask her to try to use it if she have pain but this is the first episode ever she had recently. COPD.  Stable noted. Essential hypertension blood pressure well-controlled continue present management. Dyslipidemia I do not have any recent fasting lipid profile continue monitoring   Medication Adjustments/Labs and Tests Ordered: Current medicines are reviewed at length with the patient today.  Concerns regarding medicines are outlined above.  Orders Placed This Encounter  Procedures   EKG 12-Lead   Medication changes: No orders of the defined types were placed in this encounter.   Signed, [place] DOROTHA Bernie, MD, [facility] 03/18/2024 11:03 AM    [facility]

## 2024-03-18 NOTE — Patient Instructions (Signed)

## 2024-04-01 DIAGNOSIS — J300 Vasomotor rhinitis: Secondary | ICD-10-CM | POA: Insufficient documentation

## 2024-04-14 DIAGNOSIS — H8110 Benign paroxysmal vertigo, unspecified ear: Secondary | ICD-10-CM | POA: Insufficient documentation

## 2024-04-14 DIAGNOSIS — R42 Dizziness and giddiness: Secondary | ICD-10-CM | POA: Insufficient documentation

## 2024-06-09 DIAGNOSIS — I1310 Hypertensive heart and chronic kidney disease without heart failure, with stage 1 through stage 4 chronic kidney disease, or unspecified chronic kidney disease: Secondary | ICD-10-CM | POA: Insufficient documentation

## 2024-06-09 DIAGNOSIS — Z79899 Other long term (current) drug therapy: Secondary | ICD-10-CM | POA: Insufficient documentation

## 2024-06-09 DIAGNOSIS — R748 Abnormal levels of other serum enzymes: Secondary | ICD-10-CM | POA: Insufficient documentation

## 2024-06-21 ENCOUNTER — Encounter: Payer: Self-pay | Admitting: *Deleted

## 2024-06-22 ENCOUNTER — Other Ambulatory Visit: Payer: Self-pay

## 2024-06-22 ENCOUNTER — Emergency Department (HOSPITAL_BASED_OUTPATIENT_CLINIC_OR_DEPARTMENT_OTHER)

## 2024-06-22 ENCOUNTER — Encounter (HOSPITAL_BASED_OUTPATIENT_CLINIC_OR_DEPARTMENT_OTHER): Payer: Self-pay

## 2024-06-22 ENCOUNTER — Encounter: Payer: Self-pay | Admitting: Cardiology

## 2024-06-22 ENCOUNTER — Ambulatory Visit: Attending: Cardiology | Admitting: Cardiology

## 2024-06-22 ENCOUNTER — Emergency Department (HOSPITAL_BASED_OUTPATIENT_CLINIC_OR_DEPARTMENT_OTHER)
Admission: EM | Admit: 2024-06-22 | Discharge: 2024-06-22 | Disposition: A | Discharge status: Home / Self Care | Attending: Emergency Medicine | Admitting: Emergency Medicine

## 2024-06-22 VITALS — BP 120/78 | HR 71 | Ht 65.5 in | Wt 239.0 lb

## 2024-06-22 DIAGNOSIS — I2510 Atherosclerotic heart disease of native coronary artery without angina pectoris: Secondary | ICD-10-CM

## 2024-06-22 DIAGNOSIS — I480 Paroxysmal atrial fibrillation: Secondary | ICD-10-CM

## 2024-06-22 DIAGNOSIS — Z79899 Other long term (current) drug therapy: Secondary | ICD-10-CM | POA: Insufficient documentation

## 2024-06-22 DIAGNOSIS — I130 Hypertensive heart and chronic kidney disease with heart failure and stage 1 through stage 4 chronic kidney disease, or unspecified chronic kidney disease: Secondary | ICD-10-CM | POA: Diagnosis not present

## 2024-06-22 DIAGNOSIS — Z7982 Long term (current) use of aspirin: Secondary | ICD-10-CM | POA: Insufficient documentation

## 2024-06-22 DIAGNOSIS — J189 Pneumonia, unspecified organism: Secondary | ICD-10-CM | POA: Diagnosis not present

## 2024-06-22 DIAGNOSIS — J449 Chronic obstructive pulmonary disease, unspecified: Secondary | ICD-10-CM

## 2024-06-22 DIAGNOSIS — I10 Essential (primary) hypertension: Secondary | ICD-10-CM

## 2024-06-22 DIAGNOSIS — G4733 Obstructive sleep apnea (adult) (pediatric): Secondary | ICD-10-CM

## 2024-06-22 DIAGNOSIS — N183 Chronic kidney disease, stage 3 unspecified: Secondary | ICD-10-CM | POA: Insufficient documentation

## 2024-06-22 DIAGNOSIS — R519 Headache, unspecified: Secondary | ICD-10-CM

## 2024-06-22 DIAGNOSIS — I5033 Acute on chronic diastolic (congestive) heart failure: Secondary | ICD-10-CM | POA: Diagnosis not present

## 2024-06-22 DIAGNOSIS — R42 Dizziness and giddiness: Secondary | ICD-10-CM | POA: Insufficient documentation

## 2024-06-22 LAB — COMPREHENSIVE METABOLIC PANEL WITH GFR
ALT: 8 U/L (ref 0–44)
AST: 17 U/L (ref 15–41)
Albumin: 3.8 g/dL (ref 3.5–5.0)
Alkaline Phosphatase: 118 U/L (ref 38–126)
Anion gap: 8 (ref 5–15)
BUN: 13 mg/dL (ref 8–23)
CO2: 30 mmol/L (ref 22–32)
Calcium: 9.6 mg/dL (ref 8.9–10.3)
Chloride: 100 mmol/L (ref 98–111)
Creatinine, Ser: 1.17 mg/dL — ABNORMAL HIGH (ref 0.44–1.00)
GFR, Estimated: 46 mL/min — ABNORMAL LOW
Glucose, Bld: 100 mg/dL — ABNORMAL HIGH (ref 70–99)
Potassium: 4.3 mmol/L (ref 3.5–5.1)
Sodium: 138 mmol/L (ref 135–145)
Total Bilirubin: 0.5 mg/dL (ref 0.0–1.2)
Total Protein: 7.9 g/dL (ref 6.5–8.1)

## 2024-06-22 LAB — RESP PANEL BY RT-PCR (RSV, FLU A&B, COVID)  RVPGX2
Influenza A by PCR: NEGATIVE
Influenza B by PCR: NEGATIVE
Resp Syncytial Virus by PCR: NEGATIVE
SARS Coronavirus 2 by RT PCR: NEGATIVE

## 2024-06-22 LAB — CBC WITH DIFFERENTIAL/PLATELET
Abs Immature Granulocytes: 0.03 K/uL (ref 0.00–0.07)
Basophils Absolute: 0 K/uL (ref 0.0–0.1)
Basophils Relative: 0 %
Eosinophils Absolute: 0.3 K/uL (ref 0.0–0.5)
Eosinophils Relative: 4 %
HCT: 39.3 % (ref 36.0–46.0)
Hemoglobin: 12.2 g/dL (ref 12.0–15.0)
Immature Granulocytes: 0 %
Lymphocytes Relative: 29 %
Lymphs Abs: 2.6 K/uL (ref 0.7–4.0)
MCH: 26.6 pg (ref 26.0–34.0)
MCHC: 31 g/dL (ref 30.0–36.0)
MCV: 85.6 fL (ref 80.0–100.0)
Monocytes Absolute: 1 K/uL (ref 0.1–1.0)
Monocytes Relative: 10 %
Neutro Abs: 5.2 K/uL (ref 1.7–7.7)
Neutrophils Relative %: 57 %
Platelets: 160 K/uL (ref 150–400)
RBC: 4.59 MIL/uL (ref 3.87–5.11)
RDW: 14.5 % (ref 11.5–15.5)
WBC: 9.1 K/uL (ref 4.0–10.5)
nRBC: 0 % (ref 0.0–0.2)

## 2024-06-22 LAB — MAGNESIUM: Magnesium: 2 mg/dL (ref 1.7–2.4)

## 2024-06-22 LAB — TROPONIN T, HIGH SENSITIVITY: Troponin T High Sensitivity: 12 ng/L (ref 0–19)

## 2024-06-22 MED ORDER — MECLIZINE HCL 25 MG PO TABS
25.0000 mg | ORAL_TABLET | Freq: Once | ORAL | Status: AC
  Administered 2024-06-22: 25 mg via ORAL
  Filled 2024-06-22: qty 1

## 2024-06-22 MED ORDER — MECLIZINE HCL 12.5 MG PO TABS: 12.5000 mg | ORAL_TABLET | Freq: Two times a day (BID) | ORAL | 0 refills | Status: AC | PRN

## 2024-06-22 MED ORDER — METOCLOPRAMIDE HCL 5 MG PO TABS: 5.0000 mg | ORAL_TABLET | Freq: Three times a day (TID) | ORAL | 0 refills | Status: AC | PRN

## 2024-06-22 MED ORDER — DOXYCYCLINE HYCLATE 100 MG PO CAPS: 100.0000 mg | ORAL_CAPSULE | Freq: Two times a day (BID) | ORAL | 0 refills | Status: AC

## 2024-06-22 NOTE — ED Triage Notes (Signed)
 Dizziness, lightheaded, headache, cough for 1 week

## 2024-06-22 NOTE — ED Notes (Signed)
 Pt. Reports she is feeling better and would like something to eat.  Pt. Was given crackers and drink.

## 2024-06-22 NOTE — Discharge Instructions (Addendum)
 It was a pleasure caring for you today in the emergency department.  Your workup today was reassuring.  Your x-ray shows a possible pneumonia.  I have started antibiotics to treat this.    I also prescribed you medication to help with your dizziness called meclizine .  You can take this every 12 hours as needed.  You can take over-the-counter acetaminophen  or ibuprofen  as needed for headaches.  Still having headache after these over-the-counter medication she can take a dose of metoclopramide /Reglan   Please follow-up with your PCP in the next week for recheck to ensure improvement of symptoms.   Please return to the emergency department for any worsening or worrisome symptoms.

## 2024-06-22 NOTE — Progress Notes (Unsigned)
 " Cardiology Office Note:    Date:  06/22/2024   ID:  Kristina Mcguire, DOB [birth year], MRN [id number]  PCP:  Artis Roderick Kay, PA-C  Cardiologist:  Lamar Fitch, MD    Referring MD: Artis Roderick Kay, PA-C   Chief Complaint  Patient presents with   Follow-up    History of Present Illness:    Kristina Mcguire is a 84 y.o. female Past medical history significant for paroxysmal atrial fibrillation, CHA2DS2-VASc equals 5, not anticoagulated but she does have a Watchman device, remote coronary artery disease, dyslipidemia, essential hypertension.  Comes today to months for regular follow-up.  She complains of having dizziness.  She did see some ENT physician who gave her some exercises and prescribed therapy when she started doing that she started feeling worse and she stopped it today she complained of having severe headache and she is actually planning to go to the emergency room after her visit with me.  Cardiac wise denies have any palpitations.  She described to have some chest pain on the left side of her chest but worse with movement of her shoulder worse with pressing the chest wall that happened couple days ago.  Past Medical History:  Diagnosis Date   Acute on chronic diastolic congestive heart failure (HCC) 12/27/2016   Benign essential hypertension 01/05/2016   Last Assessment & Plan:  At goal controlled continue same medications DASH diet reinforced   Chronic intracranial subdural hematoma (HCC) 12/26/2016   Last Assessment & Plan:  Patient for TCM. Admission note, discharge summary and instructions have been reviewed.  Patient admitted for headache, and found to have a left subacute subdural hematoma. Patient status post  Surgical intervention, prior to reversal of pradaxa. Patient doing very good, no complications from procedure. Patient will have follow up appointment with neurosurgery on 8/14 for    Chronic kidney disease, stage III (moderate) (HCC) 05/07/2016   Last  Assessment & Plan:  Stable,  monitoring,   Chronic midline low back pain without sciatica 05/07/2016   Last Assessment & Plan:  Formatting of this note might be different from the original. Stable, lorcet prescription renewed, last prescription issued 10/2016 for 90 tablets   Class 3 severe obesity due to excess calories with serious comorbidity and body mass index (BMI) of 40.0 to 44.9 in adult [facility]) 08/14/2017   Last Assessment & Plan:  Advised to start lifestyle changes: increasing physical activity at least 30 minutes 5 times a week, or as tolerated. Discussed low fat, low carb, small portion diet. Last Assessment & Plan:  Advised to start lifestyle changes: increasing physical activity at least 30 minutes 5 times a week, or as tolerated. Discussed low fat, low carb, small portion diet.  Formatting of t   COPD (chronic obstructive pulmonary disease) (HCC) 01/05/2016   Last Assessment & Plan:  Never smoked in the past nor present, but she has been diagnosed with COPD. Restarted home dose PRN Albuterol . Last Assessment & Plan:  Never smoked in the past nor present, but she has been diagnosed with COPD. Restarted home dose PRN Albuterol .   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  This is a very pleasant 84 y.o.  who is status post left frontal parietal occipital bur holes for evacuation of chronic subdural hematoma by Dr. Clerance on 12/27/2016.  Patient states symptoms experienced prior to surgery have improved.  Patient denies vision changes, tinnitus, dysphagia,  dysarthria, seizure activity, or loss of consciousness since    History of subdural hematoma 12/25/2016   Formatting of this note might be different from the original. Overview:  Last Assessment & Plan:  Patient has been doing well since her discharge from the hospital and since her recent surgery on 12/27/2016.  She denies all neurologic complaints.  She states that the right-sided weakness that she was experiencing prior to surgery has completely resolved.  She denies any headaches at this time.  Sh   Microcytic anemia 01/05/2016   Mixed hyperlipidemia 12/08/2018   Osteopenia of multiple sites 05/07/2016   Last Assessment & Plan:  Formatting of this note might be different from the original. Severe as per last BMD   Paroxysmal atrial fibrillation (HCC) 01/05/2016   Last Assessment & Plan:  Patient is rate controlled, sinus rhythm. On beta blocker, off anticoagulation and antiplatelets since contraindicated at this time due to recent subdural hematoma. Last Assessment & Plan:  Patient is rate controlled, sinus rhythm. On beta blocker, off anticoagulation and antiplatelets since contraindicated at this time due to recent subdural hematoma.  Formatting of this    Presence of Watchman left atrial appendage closure device 03/17/2018   Done in [facility] by Dr. 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 Anti-K Formatting of this note might be different from the original. 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  Otherwise, previous echocardiogram showed normal left ventricular systolic function.  As such, a cardiac etiology is less likely.  Recommendation:  No cardiac intervention is needed at the moment.  She should continue the COPD treatment and  follow up with   Subdural hemorrhage (HCC) 12/25/2016   Last Assessment & Plan:  Patient has been doing well since her discharge from the hospital and since her recent surgery on 12/27/2016.  She denies all neurologic complaints.  She states that the right-sided weakness that she was experiencing prior to surgery has completely resolved.  She denies any headaches at this time.  She denies any issues with loss of consciousness or seizure-like activity.    Uncomplicated opioid dependence (HCC) 07/24/2016   Last Assessment & Plan:  Referring to [facility] program On hydrocodone  daily PRN, Last Prescription filled 07/24/16. No drug seeking behavior, sign of abuse or misuse    Past Surgical History:  Procedure Laterality Date   ABDOMINAL HYSTERECTOMY     BURR HOLE FOR SUBDURAL HEMATOMA     EYE SURGERY     TUBAL LIGATION      Current Medications: Active Medications[1]   Allergies:   Penicillins, Bee pollen, Nsaids, and Pollen extract   Social History   Socioeconomic History   Marital status: Single    Spouse name: Not on file   Number of children: Not on file   Years of education: Not on file  Highest education level: Not on file  Occupational History   Not on file  Tobacco Use   Smoking status: Never    Passive exposure: Never   Smokeless tobacco: Never  Vaping Use   Vaping status: Never Used  Substance and Sexual Activity   Alcohol use: Never   Drug use: Never   Sexual activity: Not on file  Other Topics Concern   Not on file  Social History Narrative   Not on file   Social Drivers of Health   Tobacco Use: Low Risk (06/22/2024)   Patient History    Smoking Tobacco Use: Never    Smokeless Tobacco Use: Never    Passive Exposure: Never  Financial Resource Strain: Not on file  Food Insecurity: Low Risk (06/09/2024)   Received from [facility]   Epic    Within the past 12 months, you worried that your food would run out before you got money to buy more: Never true    Within  the past 12 months, the food you bought just didn't last and you didn't have money to get more. : Never true  Transportation Needs: No Transportation Needs (06/09/2024)   Received from [facility]    In the past 12 months, has lack of reliable transportation kept you from medical appointments, meetings, work or from getting things needed for daily living? : No  Physical Activity: Not on file  Stress: Not on file  Social Connections: Not on file  Depression (EYV7-0): Not on file  Alcohol Screen: Not on file  Housing: Low Risk (06/09/2024)   Received from [facility]   Epic    What is your living situation today?: I have a steady place to live    Think about the place you live. Do you have problems with any of the following? Choose all that apply:: None/None on this list  Utilities: Low Risk (06/09/2024)   Received from [facility]   Utilities    In the past 12 months has the electric, gas, oil, or water company threatened to shut off services in your home? : No  Health Literacy: Not on file     Family History: The patient's family history includes Heart disease in her father, mother, and sister; Hypertension in her mother and sister; Stroke in her mother. ROS:   Please see the history of present illness.    All 14 point review of systems negative except as described per history of present illness  EKGs/Labs/Other Studies Reviewed:         Recent Labs: 03/09/2024: BUN 15; Creatinine, Ser 1.12; Hemoglobin 13.0; Platelets 167; Potassium 4.4; Sodium 138  Recent Lipid Panel No results found for: CHOL, TRIG, HDL, CHOLHDL, VLDL, LDLCALC, LDLDIRECT  Physical Exam:    VS:  BP 120/78   Pulse 71   Ht 5' 5.5 (1.664 m)   Wt 239 lb (108.4 kg)   SpO2 99%   BMI 39.17 kg/m     Wt Readings from Last 3 Encounters:  06/22/24 239 lb (108.4 kg)  03/09/24 233 lb (105.7 kg)  01/26/24 233 lb (105.7 kg)     GEN:  Well nourished, well developed in no acute  distress HEENT: Normal NECK: No JVD; No carotid bruits LYMPHATICS: No lymphadenopathy CARDIAC: RRR, no murmurs, no rubs, no gallops RESPIRATORY:  Clear to auscultation without rales, wheezing or rhonchi  ABDOMEN: Soft, non-tender, non-distended MUSCULOSKELETAL:  No edema; No deformity  SKIN: Warm and dry LOWER EXTREMITIES: no swelling NEUROLOGIC:  Alert and oriented x 3 PSYCHIATRIC:  Normal affect   ASSESSMENT:    1. Benign essential hypertension   2. Coronary artery disease involving native coronary artery of native heart without angina pectoris   3. Paroxysmal atrial fibrillation (HCC)   4. Chronic obstructive pulmonary disease, unspecified COPD type (HCC)   5. OSA (obstructive sleep apnea)    PLAN:    In order of problems listed above:  Benign essential hypertension blood pressure well-controlled continue present management. Coronary artery disease remote stable she does have a chest pain but very atypical.  If truly she goes to the emergency room today there may be value of checking troponin but have low level suspicion that this is even coronary event. Paroxysmal atrial fibrillation rate seems to be regular she is not anticoagulated but have Watchman device. COPD.  Noted. Obstructive sleep apnea followed by primary care physician   Medication Adjustments/Labs and Tests Ordered: Current medicines are reviewed at length with the patient today.  Concerns regarding medicines are outlined above.  No orders of the defined types were placed in this encounter.  Medication changes: No orders of the defined types were placed in this encounter.   Signed, Lamar DOROTHA Fitch, MD, [facility] 06/22/2024 2:35 PM    [facility]    [1]  Current Meds  Medication Sig   albuterol  (PROVENTIL  HFA;VENTOLIN  HFA) 108 (90 Base) MCG/ACT inhaler Inhale 2 puffs into the lungs every 6 (six) hours as needed for wheezing or shortness of breath.   aspirin  EC 81 MG tablet Take 1  tablet (81 mg total) by mouth daily.   atorvastatin (LIPITOR) 20 MG tablet Take 20 mg by mouth daily.   carvedilol (COREG) 12.5 MG tablet Take 1 tablet by mouth 2 (two) times daily.   cetirizine (ZYRTEC) 10 MG tablet Take 1 tablet by mouth daily.   Cholecalciferol 25 MCG (1000 UT) tablet Take 2,000 Units by mouth 2 (two) times daily.   diclofenac Sodium (VOLTAREN) 1 % GEL Apply 2 g topically every 6 (six) hours as needed (arthritis).   digoxin  (LANOXIN ) 0.125 MG tablet Take 1 tablet (125 mcg total) by mouth every other day.   docusate sodium (COLACE) 100 MG capsule Take 1 capsule by mouth as needed for mild constipation or moderate constipation.   DULoxetine (CYMBALTA) 30 MG capsule Take 30 mg by mouth daily.   ferrous sulfate 325 (65 FE) MG tablet Take 1 tablet by mouth daily.   fluticasone (FLONASE) 50 MCG/ACT nasal spray Place 1 spray into the nose as needed for allergies.   furosemide (LASIX) 20 MG tablet Take 1 tablet by mouth 2 (two) times daily.   lisinopril (PRINIVIL,ZESTRIL) 20 MG tablet Take 1 tablet by mouth daily.   magnesium oxide (MAG-OX) 400 (240 Mg) MG tablet Take 1 tablet by mouth daily.   Multiple Vitamins-Minerals (MULTIVITAMIN ADULT PO) Take 1 tablet by mouth daily. Unknown strength   nitroGLYCERIN  (NITROSTAT ) 0.4 MG SL tablet Place 1 tablet (0.4 mg total) under the tongue every 5 (five) minutes as needed for chest pain.   pantoprazole (PROTONIX) 20 MG tablet Take 1 tablet by mouth daily.   polyvinyl alcohol (LIQUIFILM TEARS) 1.4 % ophthalmic solution Place 2 drops into both eyes as needed for dry eyes.   potassium chloride (K-DUR) 10 MEQ tablet Take 1 tablet by mouth daily.   "

## 2024-06-22 NOTE — ED Provider Notes (Signed)
 " [facility] Provider Note  CSN: [id number] Arrival date & time: 06/22/24 1454  Chief Complaint(s) Dizziness  HPI Kristina Mcguire is a 84 y.o. female with past medical history as below, significant for diastolic heart failure, hypertension, subdural hematoma, CKD, obesity, COPD, CAD, GERD who presents to the ED with complaint of dizziness  Patient reports 2 months of intermittent dizziness, lightheaded sensation, headache  Patient work symptoms ongoing over the past 2 or 3 months, headache has been intermittent but more frequent occurrences over the past month or so.  Throbbing, aching sensation, sometimes a needlelike, sharp sensation.  No vision changes.  No numbness or weakness to her extremities.  Also reports periodically she has a spinning sensation or sometimes a lightheaded sensation that seems to occur somewhat at random.  She was seen by ENT previously, she did some vestibular therapy but this seems to have made her lightheaded/dizziness somewhat worse.  Has not been taking any medication for her vertigo.  She was seen by cardiology this morning for routine checkup.  Patient also reports few days ago she had chest pain to her right shoulder which has since resolved  Past Medical History Past Medical History:  Diagnosis Date   Acute on chronic diastolic congestive heart failure (HCC) 12/27/2016   Benign essential hypertension 01/05/2016   Last Assessment & Plan:  At goal controlled continue same medications DASH diet reinforced   Chronic intracranial subdural hematoma (HCC) 12/26/2016   Last Assessment & Plan:  Patient for TCM. Admission note, discharge summary and instructions have been reviewed.  Patient admitted for headache, and found to have a left subacute subdural hematoma. Patient status post  Surgical intervention, prior to reversal of pradaxa. Patient doing very good, no complications from procedure. Patient will have follow up  appointment with neurosurgery on 8/14 for    Chronic kidney disease, stage III (moderate) (HCC) 05/07/2016   Last Assessment & Plan:  Stable,  monitoring,   Chronic midline low back pain without sciatica 05/07/2016   Last Assessment & Plan:  Formatting of this note might be different from the original. Stable, lorcet prescription renewed, last prescription issued 10/2016 for 90 tablets   Class 3 severe obesity due to excess calories with serious comorbidity and body mass index (BMI) of 40.0 to 44.9 in adult [facility]) 08/14/2017   Last Assessment & Plan:  Advised to start lifestyle changes: increasing physical activity at least 30 minutes 5 times a week, or as tolerated. Discussed low fat, low carb, small portion diet. Last Assessment & Plan:  Advised to start lifestyle changes: increasing physical activity at least 30 minutes 5 times a week, or as tolerated. Discussed low fat, low carb, small portion diet.  Formatting of t   COPD (chronic obstructive pulmonary disease) (HCC) 01/05/2016   Last Assessment & Plan:  Never smoked in the past nor present, but she has been diagnosed with COPD. Restarted home dose PRN Albuterol . Last Assessment & Plan:  Never smoked in the past nor present, but she has been diagnosed with COPD. Restarted home dose PRN Albuterol .   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  This is a very pleasant 84 y.o.  who is status post left frontal parietal occipital bur holes for evacuation of chronic subdural hematoma by Dr. Clerance on 12/27/2016.  Patient  states symptoms experienced prior to surgery have improved.  Patient denies vision changes, tinnitus, dysphagia, dysarthria, seizure activity, or loss of consciousness since    History of subdural hematoma 12/25/2016   Formatting of this note might be different from the original. Overview:  Last Assessment & Plan:  Patient has been doing well since her discharge from the hospital and since her recent surgery on 12/27/2016.  She denies all neurologic complaints.  She states that the right-sided weakness that she was experiencing prior to surgery has completely resolved.  She denies any headaches at this time.  Sh   Microcytic anemia 01/05/2016   Mixed hyperlipidemia 12/08/2018   Osteopenia of multiple sites 05/07/2016   Last Assessment & Plan:  Formatting of this note might be different from the original. Severe as per last BMD   Paroxysmal atrial fibrillation (HCC) 01/05/2016   Last Assessment & Plan:  Patient is rate controlled, sinus rhythm. On beta blocker, off anticoagulation and antiplatelets since contraindicated at this time due to recent subdural hematoma. Last Assessment & Plan:  Patient is rate controlled, sinus rhythm. On beta blocker, off anticoagulation and antiplatelets since contraindicated at this time due to recent subdural hematoma.  Formatting of this    Presence of Watchman left atrial appendage closure device 03/17/2018   Done in [facility] by Dr. 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 Anti-K Formatting of this note might be different from the original. 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  Otherwise, previous echocardiogram showed normal left ventricular systolic function.  As such, a cardiac etiology is less likely.   Recommendation:  No cardiac intervention is needed at the moment.  She should continue the COPD treatment and follow up with   Subdural hemorrhage (HCC) 12/25/2016   Last Assessment & Plan:  Patient has been doing well since her discharge from the hospital and since her recent surgery on 12/27/2016.  She denies all neurologic complaints.  She states that the right-sided weakness that she was experiencing prior to surgery has completely resolved.  She denies any headaches at this time.  She denies any issues with loss of consciousness or seizure-like activity.    Uncomplicated opioid dependence (HCC) 07/24/2016   Last Assessment & Plan:  Referring to [facility] program On hydrocodone  daily PRN, Last Prescription filled 07/24/16. 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   Other long term (current) drug therapy 06/09/2024   Benign paroxysmal positional nystagmus, unspecified laterality 04/14/2024   Dizziness 04/14/2024   Vasomotor rhinitis 04/01/2024   Hypomagnesemia 12/13/2023   Prediabetes 12/13/2023   OSA (obstructive sleep apnea) 06/11/2023   Restrictive lung disease 03/19/2023   Decreased activities of daily living (ADL) 09/06/2022   Impaired functional mobility, balance, gait,  and endurance 09/05/2022   Preop cardiovascular exam 08/07/2022   Incisional hernia, without obstruction or gangrene 07/12/2022   Acute non-recurrent ethmoidal sinusitis 06/27/2022   Body mass index 40.0-44.9, adult (HCC) 05/21/2022   Left inguinal hernia 05/21/2022   Allergy to pollen 05/01/2021   Multiple thyroid nodules 05/01/2021   Lung nodules 04/04/2021   Right thyroid nodule 04/04/2021   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   Mixed hyperlipidemia 12/08/2018   Presence of Watchman left atrial appendage closure device 03/17/2018   Red blood cell antibody  positive 03/05/2018   Generalized headaches 12/17/2017   Coronary artery disease 09/17/2017   Class 3 severe obesity due to excess calories with serious comorbidity and body mass index (BMI) of 40.0 to 44.9 in adult (HCC) 08/14/2017   Poor dentition 08/14/2017   History of burr hole surgery 02/04/2017   Acute on chronic diastolic congestive heart failure (HCC) 12/27/2016   Heart failure with preserved ejection fraction (HCC) 12/27/2016   Chronic intracranial subdural hematoma (HCC) 12/26/2016   History of subdural hematoma 12/25/2016   Subdural hemorrhage (HCC) 12/25/2016   Shortness of breath 10/31/2016   Uncomplicated opioid dependence (HCC) 07/24/2016   Chronic renal disease, stage III (HCC) 05/07/2016   Chronic midline low back pain without sciatica 05/07/2016   Osteopenia of multiple sites 05/07/2016   Benign essential hypertension 01/05/2016   COPD (chronic obstructive pulmonary disease) (HCC) 01/05/2016   GERD (gastroesophageal reflux disease) 01/05/2016   Microcytic anemia 01/05/2016   Paroxysmal atrial fibrillation (HCC) 01/05/2016   Home Medication(s) Prior to Admission medications  Medication Sig Start Date End Date Taking? Authorizing Provider  doxycycline  (VIBRAMYCIN ) 100 MG capsule Take 1 capsule (100 mg total) by mouth 2 (two) times daily for 5 days. 06/22/24 06/27/24 Yes Elnor Jayson LABOR, DO  meclizine  (ANTIVERT ) 12.5 MG tablet Take 1 tablet (12.5 mg total) by mouth 2 (two) times daily as needed for dizziness. 06/22/24  Yes Elnor Jayson A, DO  metoCLOPramide  (REGLAN ) 5 MG tablet Take 1 tablet (5 mg total) by mouth every 8 (eight) hours as needed (headache). 06/22/24  Yes Elnor Jayson LABOR, DO  albuterol  (PROVENTIL  HFA;VENTOLIN  HFA) 108 (90 Base) MCG/ACT inhaler Inhale 2 puffs into the lungs every 6 (six) hours as needed for wheezing or shortness of breath.    [provider]  aspirin  EC 81 MG tablet Take 1 tablet (81 mg total) by mouth daily. 11/13/18   Krasowski, Robert  J, MD  atorvastatin (LIPITOR) 20 MG tablet Take 20 mg by mouth daily. 02/12/17   [provider]  carvedilol (COREG) 12.5 MG tablet Take 1 tablet by mouth 2 (two) times daily. 02/07/17   [provider]  cetirizine (ZYRTEC) 10 MG tablet Take 1 tablet by mouth daily. 11/07/16   [provider]  Cholecalciferol 25 MCG (1000 UT) tablet Take 2,000 Units by mouth 2 (two) times daily.    [provider]  diclofenac Sodium (VOLTAREN) 1 % GEL Apply 2 g topically every 6 (six) hours as needed (arthritis). 09/28/19   [provider]  digoxin  (LANOXIN ) 0.125 MG tablet Take 1 tablet (125 mcg total) by mouth every other day. 07/24/18   Krasowski, Robert J, MD  docusate sodium (COLACE) 100 MG capsule Take 1 capsule by mouth as needed for mild constipation or moderate constipation. 12/31/16   [provider]  DULoxetine (CYMBALTA) 30 MG capsule Take 30 mg by mouth daily. 12/19/19   [provider]  ferrous sulfate 325 (65 FE) MG tablet Take 1 tablet  by mouth daily.    [provider]  fluticasone (FLONASE) 50 MCG/ACT nasal spray Place 1 spray into the nose as needed for allergies. 03/19/16   [provider]  furosemide (LASIX) 20 MG tablet Take 1 tablet by mouth 2 (two) times daily. 11/07/16   [provider]  lisinopril (PRINIVIL,ZESTRIL) 20 MG tablet Take 1 tablet by mouth daily. 02/07/17   [provider]  magnesium oxide (MAG-OX) 400 (240 Mg) MG tablet Take 1 tablet by mouth daily. 12/13/23   [provider]  Multiple Vitamins-Minerals (MULTIVITAMIN ADULT PO) Take 1 tablet by mouth daily. Unknown strength    [provider]  nitroGLYCERIN  (NITROSTAT ) 0.4 MG SL tablet Place 1 tablet (0.4 mg total) under the tongue every 5 (five) minutes as needed for chest pain. 03/10/24   Raford Lenis, MD  pantoprazole (PROTONIX) 20 MG tablet Take 1 tablet by mouth daily. 02/12/17   [provider]  polyvinyl alcohol  (LIQUIFILM TEARS) 1.4 % ophthalmic solution Place 2 drops into both eyes as needed for dry eyes.    [provider]  potassium chloride (K-DUR) 10 MEQ tablet Take 1 tablet by mouth daily. 03/25/17   [provider]                                                                                                                                    Past Surgical History Past Surgical History:  Procedure Laterality Date   ABDOMINAL HYSTERECTOMY     BURR HOLE FOR SUBDURAL HEMATOMA     EYE SURGERY     TUBAL LIGATION     Family History Family History  Problem Relation Age of Onset   Heart disease Mother    Hypertension Mother    Stroke Mother    Heart disease Father    Heart disease Sister    Hypertension Sister     Social History Social History[1] Allergies Penicillins, Bee pollen, Nsaids, and Pollen extract  Review of Systems A thorough review of systems was obtained and all systems are negative except as noted in the HPI and PMH.   Physical Exam Vital Signs  I have reviewed the triage vital signs BP 136/77   Pulse 70   Temp 98.2 F (36.8 C)   Resp 20   SpO2 100%  Physical Exam Vitals and nursing note reviewed.  Constitutional:      General: She is not in acute distress.    Appearance: Normal appearance. She is well-developed. She is obese. She is not ill-appearing.  HENT:     Head: Normocephalic and atraumatic.     Right Ear: External ear normal.     Left Ear: External ear normal.     Nose: Nose normal.     Mouth/Throat:     Mouth: Mucous membranes are moist.  Eyes:     General: No scleral icterus.       Right eye: No  discharge.        Left eye: No discharge.     Extraocular Movements: Extraocular movements intact.     Pupils: Pupils are equal, round, and reactive to light.  Cardiovascular:     Rate and Rhythm: Normal rate.  Pulmonary:     Effort: Pulmonary effort is normal. No respiratory distress.     Breath sounds: No stridor.  Abdominal:      General: Abdomen is flat. There is no distension.     Tenderness: There is no guarding.  Musculoskeletal:        General: No deformity.     Cervical back: No rigidity.  Skin:    General: Skin is warm and dry.     Coloration: Skin is not cyanotic, jaundiced or pale.  Neurological:     Mental Status: She is alert and oriented to person, place, and time.     GCS: GCS eye subscore is 4. GCS verbal subscore is 5. GCS motor subscore is 6.     Cranial Nerves: Cranial nerves 2-12 are intact. No dysarthria.     Sensory: Sensation is intact.     Motor: Motor function is intact.     Coordination: Coordination is intact. Coordination normal. Finger-Nose-Finger Test normal.     Comments: Strength 5/5 to BLUE/BLLE, equal and symmetric    Psychiatric:        Speech: Speech normal.        Behavior: Behavior normal. Behavior is cooperative.     ED Results and Treatments Labs (all labs ordered are listed, but only abnormal results are displayed) Labs Reviewed  COMPREHENSIVE METABOLIC PANEL WITH GFR - Abnormal; Notable for the following components:      Result Value   Glucose, Bld 100 (*)    Creatinine, Ser 1.17 (*)    GFR, Estimated 46 (*)    All other components within normal limits  RESP PANEL BY RT-PCR (RSV, FLU A&B, COVID)  RVPGX2  CBC WITH DIFFERENTIAL/PLATELET  MAGNESIUM  TSH  T4, FREE  TROPONIN T, HIGH SENSITIVITY                                                                                                                          Radiology DG Chest Portable 1 View Result Date: 06/22/2024 CLINICAL DATA:  Dizziness, lightheaded, headache and cough for 1 week. EXAM: PORTABLE CHEST 1 VIEW COMPARISON:  March 09, 2024 FINDINGS: The cardiac silhouette is borderline in size. This may be, in part, technical in origin. A radiopaque atrial occlusion device is noted. Very mild atelectasis and/or infiltrate is noted within the right lung base. No pleural effusion or pneumothorax  identified. Multilevel degenerative changes are seen throughout the thoracic spine. IMPRESSION: Very mild right basilar atelectasis and/or infiltrate. Electronically Signed   By: Suzen Dials M.D.   On: 06/22/2024 16:58   CT Head Wo Contrast Result Date: 06/22/2024 CLINICAL DATA:  Progressively worsening headache and dizziness. EXAM: CT HEAD WITHOUT CONTRAST TECHNIQUE: Contiguous  axial images were obtained from the base of the skull through the vertex without intravenous contrast. RADIATION DOSE REDUCTION: This exam was performed according to the departmental dose-optimization program which includes automated exposure control, adjustment of the mA and/or kV according to patient size and/or use of iterative reconstruction technique. COMPARISON:  None Available. FINDINGS: Brain: There is generalized cerebral atrophy with widening of the extra-axial spaces and ventricular dilatation. There are areas of decreased attenuation within the white matter tracts of the supratentorial brain, consistent with microvascular disease changes. Vascular: No hyperdense vessel or unexpected calcification. Skull: Left frontal and left parietal burr holes are seen. Sinuses/Orbits: No acute finding. Other: None. IMPRESSION: 1. Generalized cerebral atrophy and microvascular disease changes of the supratentorial brain. 2. No acute intracranial abnormality. Electronically Signed   By: Suzen Dials M.D.   On: 06/22/2024 16:43    Pertinent labs & imaging results that were available during my care of the patient were reviewed by me and considered in my medical decision making (see MDM for details).  Medications Ordered in ED Medications  meclizine  (ANTIVERT ) tablet 25 mg (25 mg Oral Given 06/22/24 1711)                                                                                                                                     Procedures Procedures  (including critical care time)  Medical Decision Making / ED  Course    Medical Decision Making:    TAYLIE HELDER is a 84 y.o. female with past medical history as below, significant for diastolic heart failure, hypertension, subdural hematoma, CKD, obesity, COPD, CAD, GERD who presents to the ED with complaint of dizziness. The complaint involves an extensive differential diagnosis and also carries with it a high risk of complications and morbidity.  Serious etiology was considered. Ddx includes but is not limited to: Differential diagnosis includes but is not exclusive to subarachnoid hemorrhage, meningitis, encephalitis, previous head trauma, cavernous venous thrombosis, muscle tension headache, glaucoma, temporal arteritis, migraine or migraine equivalent, etc.   Complete initial physical exam performed, notably the patient was in no acute distress..    Reviewed and confirmed nursing documentation for past medical history, family history, social history.  Vital signs reviewed.    Intermittent headache> - Intermittent for the past 2 to 3 months.  Seems to be occurring more frequently.  Last time she had a headache was earlier today, greatly improved with Tylenol  - Neuroexam is intact - Not currently having any dizziness or lightheadedness - Given concern for apparent new or unusual headache will get head CT - Headache resolved - Has been taking over-the-counter acetaminophen  or ibuprofen .  Seems to be helping her headache.  Will give short course of Reglan  as needed  Possible pneumonia Cough x 1 week> - She reports cough for the past week, intermittently productive with white or sometimes yellow-colored sputum.  She has no dyspnea.  She is  not febrile, she is HDS, she is nontoxic.  Chest x-ray with possible CAP.  Will prescribe antibiotics - not septic  Chest pain> - Chest pain 2 or 3 days ago, since resolved.  Reviewed cardiology documentation from this morning.  Will check cardiac markers - Labs are stable, troponin negative.  She is not have  any chest pain.  Last chest pain was 2 to 3 days ago.  Doubt ACS  Intermittent lightheadedness> - She was seen by ENT, thought to have vertigo. - She is not currently having symptoms right now - Neuroexam is intact.  No head injuries, no concurrent chest pain with dizziness/lightheadedness - CT head is unremarkable  Clinical Course as of 06/22/24 1915  Tue Jun 22, 2024  1702 Chest x-ray with mild atelectasis versus infiltrate. [SG]  1702 CT head is stable [SG]  1845 Labs stable [SG]  1901 Feeling better [SG]    Clinical Course User Index [SG] Elnor Jayson LABOR, DO   Patient presents with headache. Based on the patient's history and physical there is very low clinical suspicion for significant intracranial pathology. The headache was not sudden onset, not maximal at onset, there are no neurologic findings on exam, the patient does not have a fever, the patient does not have any jaw claudication, the patient does not endorse a clotting disorder, patient denies any trauma or eye pain and the headache is not associated with weakness on one side of the body, diplopia, vertigo, slurred speech, or ataxia.   7:15 PM: I have discussed the diagnosis/risks/treatment options with the patient and family.  Evaluation and diagnostic testing in the emergency department does not suggest an emergent condition requiring admission or immediate intervention beyond what has been performed at this time.  They will follow up with PCP. We also discussed returning to the ED immediately if new or worsening sx occur. We discussed the sx which are most concerning (e.g., sudden worsening pain, fever, inability to tolerate by mouth ) that necessitate immediate return.    The patient appears reasonably screened and/or stabilized for discharge and I doubt any other medical condition or other [facility] requiring further screening, evaluation, or treatment in the ED at this time prior to discharge.                  Additional history obtained: -Additional history obtained from family -External records from outside source obtained and reviewed including: Chart review including previous notes, labs, imaging, consultation notes including  Recent cardiology documentation Home medications   Lab Tests: -I ordered, reviewed, and interpreted labs.   The pertinent results include:   Labs Reviewed  COMPREHENSIVE METABOLIC PANEL WITH GFR - Abnormal; Notable for the following components:      Result Value   Glucose, Bld 100 (*)    Creatinine, Ser 1.17 (*)    GFR, Estimated 46 (*)    All other components within normal limits  RESP PANEL BY RT-PCR (RSV, FLU A&B, COVID)  RVPGX2  CBC WITH DIFFERENTIAL/PLATELET  MAGNESIUM  TSH  T4, FREE  TROPONIN T, HIGH SENSITIVITY    Notable for labs are stable  EKG   EKG Interpretation Date/Time:  Tuesday June 22 2024 15:14:40 EST Ventricular Rate:  62 PR Interval:  214 QRS Duration:  100 QT Interval:  373 QTC Calculation: 379 R Axis:   -25  Text Interpretation: Sinus rhythm Atrial premature complex Borderline prolonged PR interval Borderline left axis deviation Abnormal R-wave progression, early transition Borderline T abnormalities, anterior leads Confirmed by  Elnor Savant ([id number]) on 06/22/2024 4:26:28 PM         Imaging Studies ordered: I ordered imaging studies including chest x-ray, CT head I independently visualized the following imaging with scope of interpretation limited to determining acute life threatening conditions related to emergency care; findings noted above I agree with the radiologist interpretation If any imaging was obtained with contrast I closely monitored patient for any possible adverse reaction a/w contrast administration in the emergency department   Medicines ordered and prescription drug management: Meds ordered this encounter  Medications   meclizine  (ANTIVERT ) tablet 25 mg   doxycycline   (VIBRAMYCIN ) 100 MG capsule    Sig: Take 1 capsule (100 mg total) by mouth 2 (two) times daily for 5 days.    Dispense:  10 capsule    Refill:  0   metoCLOPramide  (REGLAN ) 5 MG tablet    Sig: Take 1 tablet (5 mg total) by mouth every 8 (eight) hours as needed (headache).    Dispense:  5 tablet    Refill:  0   meclizine  (ANTIVERT ) 12.5 MG tablet    Sig: Take 1 tablet (12.5 mg total) by mouth 2 (two) times daily as needed for dizziness.    Dispense:  10 tablet    Refill:  0    -I have reviewed the patients home medicines and have made adjustments as needed   Consultations Obtained: na   Cardiac Monitoring: The patient was maintained on a cardiac monitor.  I personally viewed and interpreted the cardiac monitored which showed an underlying rhythm of: nsr Continuous pulse oximetry interpreted by myself, 100% on RA.    Social Determinants of Health:  Diagnosis or treatment significantly limited by social determinants of health: obesity   Reevaluation: After the interventions noted above, I reevaluated the patient and found that they have resolved  Co morbidities that complicate the patient evaluation  Past Medical History:  Diagnosis Date   Acute on chronic diastolic congestive heart failure (HCC) 12/27/2016   Benign essential hypertension 01/05/2016   Last Assessment & Plan:  At goal controlled continue same medications DASH diet reinforced   Chronic intracranial subdural hematoma (HCC) 12/26/2016   Last Assessment & Plan:  Patient for TCM. Admission note, discharge summary and instructions have been reviewed.  Patient admitted for headache, and found to have a left subacute subdural hematoma. Patient status post  Surgical intervention, prior to reversal of pradaxa. Patient doing very good, no complications from procedure. Patient will have follow up appointment with neurosurgery on 8/14 for    Chronic kidney disease, stage III (moderate) (HCC) 05/07/2016   Last Assessment & Plan:   Stable,  monitoring,   Chronic midline low back pain without sciatica 05/07/2016   Last Assessment & Plan:  Formatting of this note might be different from the original. Stable, lorcet prescription renewed, last prescription issued 10/2016 for 90 tablets   Class 3 severe obesity due to excess calories with serious comorbidity and body mass index (BMI) of 40.0 to 44.9 in adult [facility]) 08/14/2017   Last Assessment & Plan:  Advised to start lifestyle changes: increasing physical activity at least 30 minutes 5 times a week, or as tolerated. Discussed low fat, low carb, small portion diet. Last Assessment & Plan:  Advised to start lifestyle changes: increasing physical activity at least 30 minutes 5 times a week, or as tolerated. Discussed low fat, low carb, small portion diet.  Formatting of t   COPD (chronic obstructive pulmonary disease) (HCC) 01/05/2016  Last Assessment & Plan:  Never smoked in the past nor present, but she has been diagnosed with COPD. Restarted home dose PRN Albuterol . Last Assessment & Plan:  Never smoked in the past nor present, but she has been diagnosed with COPD. Restarted home dose PRN Albuterol .   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  This is a very pleasant 84 y.o.  who is status post left frontal parietal occipital bur holes for evacuation of chronic subdural hematoma by Dr. Clerance on 12/27/2016.  Patient states symptoms experienced prior to surgery have improved.  Patient denies vision changes, tinnitus, dysphagia, dysarthria, seizure  activity, or loss of consciousness since    History of subdural hematoma 12/25/2016   Formatting of this note might be different from the original. Overview:  Last Assessment & Plan:  Patient has been doing well since her discharge from the hospital and since her recent surgery on 12/27/2016.  She denies all neurologic complaints.  She states that the right-sided weakness that she was experiencing prior to surgery has completely resolved.  She denies any headaches at this time.  Sh   Microcytic anemia 01/05/2016   Mixed hyperlipidemia 12/08/2018   Osteopenia of multiple sites 05/07/2016   Last Assessment & Plan:  Formatting of this note might be different from the original. Severe as per last BMD   Paroxysmal atrial fibrillation (HCC) 01/05/2016   Last Assessment & Plan:  Patient is rate controlled, sinus rhythm. On beta blocker, off anticoagulation and antiplatelets since contraindicated at this time due to recent subdural hematoma. Last Assessment & Plan:  Patient is rate controlled, sinus rhythm. On beta blocker, off anticoagulation and antiplatelets since contraindicated at this time due to recent subdural hematoma.  Formatting of this    Presence of Watchman left atrial appendage closure device 03/17/2018   Done in [facility] by Dr. 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 Anti-K Formatting of this note might be different from the original. 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  Otherwise, previous echocardiogram showed normal left ventricular systolic function.  As such, a cardiac etiology is less likely.  Recommendation:  No cardiac intervention is needed at the moment.  She should continue the COPD treatment and follow up with    Subdural hemorrhage (HCC) 12/25/2016   Last Assessment & Plan:  Patient has been doing well since her discharge from the hospital and since her recent surgery on 12/27/2016.  She denies all neurologic complaints.  She states that the right-sided weakness that she was experiencing prior to surgery has completely resolved.  She denies any headaches at this time.  She denies any issues with loss of consciousness or seizure-like activity.    Uncomplicated opioid dependence (HCC) 07/24/2016  Last Assessment & Plan:  Referring to [facility] program On hydrocodone  daily PRN, Last Prescription filled 07/24/16. No drug seeking behavior, sign of abuse or misuse      Dispostion: Disposition decision including need for hospitalization was considered, and patient discharged from emergency department.    Final Clinical Impression(s) / ED Diagnoses Final diagnoses:  Nonintractable headache, unspecified chronicity pattern, unspecified headache type  Community acquired pneumonia, unspecified laterality  Vertigo         [1]  Social History Tobacco Use   Smoking status: Never    Passive exposure: Never   Smokeless tobacco: Never  Vaping Use   Vaping status: Never Used  Substance Use Topics   Alcohol use: Never   Drug use: Never     Elnor Jayson LABOR, DO 06/22/24 1915  "

## 2024-06-22 NOTE — ED Notes (Signed)
 Attempted Pt. IV but unable to obtain.  Asked Network Engineer to do [employer] IV on the Pt

## 2024-06-22 NOTE — Patient Instructions (Signed)
 Medication Instructions:  Your physician recommends that you continue on your current medications as directed. Please refer to the Current Medication list given to you today.  *If you need a refill on your cardiac medications before your next appointment, please call your pharmacy*   Lab Work: None Ordered If you have labs (blood work) drawn today and your tests are completely normal, you will receive your results only by: MyChart Message (if you have MyChart) OR A paper copy in the mail If you have any lab test that is abnormal or we need to change your treatment, we will call you to review the results.   Testing/Procedures: None Ordered   Follow-Up: At [facility], you and your health needs are our priority.  As part of our continuing mission to provide you with exceptional heart care, we have created designated Provider Care Teams.  These Care Teams include your primary Cardiologist (physician) and Advanced Practice Providers (APPs -  Physician Assistants and Nurse Practitioners) who all work together to provide you with the care you need, when you need it.  We recommend signing up for the patient portal called MyChart.  Sign up information is provided on this After Visit Summary.  MyChart is used to connect with patients for Virtual Visits (Telemedicine).  Patients are able to view lab/test results, encounter notes, upcoming appointments, etc.  Non-urgent messages can be sent to your provider as well.   To learn more about what you can do with MyChart, go to [email or web address].    Your next appointment:   5 month(s)  The format for your next appointment:   In Person  Provider:   Lamar Fitch, MD    Other Instructions NA

## 2024-06-23 LAB — TSH: TSH: 1.3 u[IU]/mL (ref 0.350–4.500)

## 2024-06-23 LAB — T4, FREE: Free T4: 1.35 ng/dL (ref 0.80–2.00)
